# Patient Record
Sex: Female | Born: 1950 | Race: White | Hispanic: No | Smoking: Never smoker
Health system: Southern US, Community
[De-identification: ages and names within clinical notes are randomized; demographics above are authoritative.]

## PROBLEM LIST (undated history)

## (undated) DIAGNOSIS — N189 Chronic kidney disease, unspecified: Secondary | ICD-10-CM

## (undated) DIAGNOSIS — K219 Gastro-esophageal reflux disease without esophagitis: Secondary | ICD-10-CM

## (undated) DIAGNOSIS — E785 Hyperlipidemia, unspecified: Secondary | ICD-10-CM

## (undated) DIAGNOSIS — Z8619 Personal history of other infectious and parasitic diseases: Secondary | ICD-10-CM

## (undated) DIAGNOSIS — J309 Allergic rhinitis, unspecified: Secondary | ICD-10-CM

## (undated) DIAGNOSIS — C801 Malignant (primary) neoplasm, unspecified: Secondary | ICD-10-CM

## (undated) HISTORY — PX: ABDOMINAL HYSTERECTOMY: SHX81

## (undated) HISTORY — PX: CARDIAC CATHETERIZATION: SHX172

## (undated) HISTORY — PX: OTHER SURGICAL HISTORY: SHX169

## (undated) HISTORY — PX: SEPTOPLASTY: SUR1290

## (undated) HISTORY — DX: Hyperlipidemia, unspecified: E78.5

## (undated) HISTORY — PX: TONSILLECTOMY: SUR1361

## (undated) HISTORY — PX: BACK SURGERY: SHX140

---

## 1983-01-15 HISTORY — PX: BREAST BIOPSY: SHX20

## 2003-01-30 ENCOUNTER — Other Ambulatory Visit: Payer: Self-pay

## 2004-01-26 ENCOUNTER — Ambulatory Visit: Payer: Self-pay | Admitting: Internal Medicine

## 2005-03-05 ENCOUNTER — Ambulatory Visit: Payer: Self-pay | Admitting: Internal Medicine

## 2006-03-04 ENCOUNTER — Ambulatory Visit: Payer: Self-pay | Admitting: Gastroenterology

## 2006-03-10 ENCOUNTER — Ambulatory Visit: Payer: Self-pay | Admitting: Internal Medicine

## 2007-05-05 ENCOUNTER — Ambulatory Visit: Payer: Self-pay | Admitting: Internal Medicine

## 2008-05-26 ENCOUNTER — Ambulatory Visit: Payer: Self-pay | Admitting: Internal Medicine

## 2009-01-27 ENCOUNTER — Ambulatory Visit: Payer: Self-pay | Admitting: Internal Medicine

## 2009-11-23 ENCOUNTER — Ambulatory Visit: Payer: Self-pay | Admitting: Internal Medicine

## 2011-01-21 ENCOUNTER — Ambulatory Visit: Payer: Self-pay | Admitting: Internal Medicine

## 2011-05-26 ENCOUNTER — Emergency Department: Payer: Self-pay | Admitting: Emergency Medicine

## 2011-05-26 LAB — COMPREHENSIVE METABOLIC PANEL
Albumin: 4 g/dL (ref 3.4–5.0)
Alkaline Phosphatase: 72 U/L (ref 50–136)
Anion Gap: 7 (ref 7–16)
BUN: 11 mg/dL (ref 7–18)
Co2: 25 mmol/L (ref 21–32)
EGFR (African American): >60
EGFR (Non-African Amer.): >60
Glucose: 97 mg/dL (ref 65–99)
SGPT (ALT): 22 U/L
Sodium: 140 mmol/L (ref 136–145)
Total Protein: 7.6 g/dL (ref 6.4–8.2)

## 2011-05-26 LAB — CK TOTAL AND CKMB (NOT AT ARMC): CK-MB: 1.3 ng/mL (ref 0.5–3.6)

## 2011-05-26 LAB — CBC
HCT: 44.7 % (ref 35.0–47.0)
MCH: 31.3 pg (ref 26.0–34.0)
MCV: 91 fL (ref 80–100)
Platelet: 145 10*3/uL — ABNORMAL LOW (ref 150–440)
RBC: 4.9 10*6/uL (ref 3.80–5.20)
RDW: 13.1 % (ref 11.5–14.5)
WBC: 5.4 10*3/uL (ref 3.6–11.0)

## 2011-05-26 LAB — PROTIME-INR: Prothrombin Time: 12.2 secs (ref 11.5–14.7)

## 2011-05-28 ENCOUNTER — Encounter: Payer: Self-pay | Admitting: Cardiovascular Disease

## 2011-05-28 ENCOUNTER — Ambulatory Visit (INDEPENDENT_AMBULATORY_CARE_PROVIDER_SITE_OTHER): Payer: BC Managed Care – PPO | Admitting: Cardiovascular Disease

## 2011-05-28 DIAGNOSIS — Z01818 Encounter for other preprocedural examination: Secondary | ICD-10-CM

## 2011-05-28 DIAGNOSIS — E785 Hyperlipidemia, unspecified: Secondary | ICD-10-CM | POA: Insufficient documentation

## 2011-05-28 DIAGNOSIS — R079 Chest pain, unspecified: Secondary | ICD-10-CM

## 2011-05-28 DIAGNOSIS — R0602 Shortness of breath: Secondary | ICD-10-CM

## 2011-05-28 DIAGNOSIS — Z5181 Encounter for therapeutic drug level monitoring: Secondary | ICD-10-CM

## 2011-05-28 MED ORDER — NITROGLYCERIN 0.4 MG SL SUBL: 0.4000 mg | SUBLINGUAL_TABLET | SUBLINGUAL | Status: DC | PRN

## 2011-05-28 NOTE — Assessment & Plan Note (Signed)
Chest pain is concerning for angina. We have discussed the various treatment options with her which include Myoview stress testing and cardiac catheterization. She will discuss this with her husband and we will contact her in the next day. She is very concerned about her symptoms and reports that she would be still worried even with a normal stress test. She is leaning towards a cardiac catheterization given her continued symptoms. She has to drive to the hospital to take care of her husband and he is coming home after chemotherapy in the next day or so. If symptoms are stable, she would consider cardiac catheterization early next week, possibly later this week if symptoms get worse.   We have given her nitroglycerin sublingual for chest pain. We will also recheck her cardiac enzymes today.

## 2011-05-28 NOTE — Patient Instructions (Signed)
You are doing well. Please take nitroglycerin for severe chest pain  We will talk in the next day or so about cardiac cath versus  Stress test We will check your kidney function function and xray today  Please call us if you have new issues that need to be addressed before your next appt.

## 2011-05-28 NOTE — Assessment & Plan Note (Signed)
Cholesterol continues to be elevated on simvastatin 20 mg daily. We did not change her medications at this time and we'll discuss additional options and followup.

## 2011-05-28 NOTE — Assessment & Plan Note (Signed)
Clinical exam is essentially benign. She continues to have mild shortness of breath. We have recommended cardiac catheterization.

## 2011-05-28 NOTE — Progress Notes (Signed)
Patient ID: Nicole Sharp, female    DOB: 1950/03/01, 61 y.o.   MRN: [id number]  HPI Comments: [name] is a very pleasant 61 year old woman, patient of Dr. Bethann Punches, with history of hyperlipidemia, otherwise no significant cardiac issues, with recent stressors from her husband who was diagnosed with leukemia and is actively undergoing chemotherapy who is active at baseline, Place tennis several times a week and walks for exercise alternate days who presents for evaluation after recent episodes of chest pain.  She reports that she was at church this past Sunday when she was singing while standing and she developed acute onset of severe chest pain. She sat down in her chest pain resolved. Chest pain recurred while sitting waxing and waning for several more episodes associated with shortness of breath and dizziness. She was lightheaded and she was escorted outside and was tended to by Dr. Erline Hau. He mentioned to her that her presentation was quite worrisome for cardiac etiology.   Since then, she has had waxing and waning symptoms of chest tightness, shortness of breath, malaise, neck tightness, some discomfort radiating to her arm. She has not felt well. She is trying to tend to her husband who is undergoing chemotherapy for leukemia. She was previously on Xanax but has not taken this for over one week and did a slow wean.   She reports recent total cholesterol 215, LDL 130. She has been on low-dose statin for 20 years her cholesterol continues to be elevated. She has been unable to tolerate several statins.   She does report mother has history of CVA at age 56, father died of colon cancer at age 58  EKG shows normal sinus rhythm with rate 69 beats per minute with no significant ST or T wave changes EKG from the hospital shows normal sinus rhythm with rate 63 beats per minute, no significant ST or T wave changes Blood work from the hospital shows normal cardiac enzymes x2     Outpatient Encounter Prescriptions as of 05/28/2011  Medication Sig Dispense Refill  . aspirin 81 MG tablet Take 81 mg by mouth daily.      . nitroGLYCERIN (NITROSTAT) 0.4 MG SL tablet Place 1 tablet (0.4 mg total) under the tongue every 5 (five) minutes as needed for chest pain.  25 tablet  3  . pantoprazole (PROTONIX) 40 MG tablet Take 40 mg by mouth daily.      Marland Kitchen SIMVASTATIN PO Take 20 mg by mouth daily.        Review of Systems  Constitutional: Negative.   HENT: Negative.        Neck tightness  Eyes: Negative.   Respiratory: Positive for chest tightness and shortness of breath.   Cardiovascular: Negative.   Gastrointestinal: Negative.   Musculoskeletal: Negative.   Skin: Negative.   Neurological: Negative.   Hematological: Negative.   Psychiatric/Behavioral: Negative.   All other systems reviewed and are negative.    BP 126/86  Pulse 69  Ht 5' 6.5" (1.689 m)  Wt 174 lb 1.9 oz (78.98 kg)  BMI 27.68 kg/m2  Physical Exam  Nursing note and vitals reviewed. Constitutional: She is oriented to person, place, and time. She appears well-developed and well-nourished.  HENT:  Head: Normocephalic.  Nose: Nose normal.  Mouth/Throat: Oropharynx is clear and moist.  Eyes: Conjunctivae are normal. Pupils are equal, round, and reactive to light.  Neck: Normal range of motion. Neck supple. No JVD present.  Cardiovascular: Normal rate, regular rhythm, S1 normal,  S2 normal, normal heart sounds and intact distal pulses.  Exam reveals no gallop and no friction rub.   No murmur heard. Pulmonary/Chest: Effort normal and breath sounds normal. No respiratory distress. She has no wheezes. She has no rales. She exhibits no tenderness.  Abdominal: Soft. Bowel sounds are normal. She exhibits no distension. There is no tenderness.  Musculoskeletal: Normal range of motion. She exhibits no edema and no tenderness.  Lymphadenopathy:    She has no cervical adenopathy.  Neurological: She is alert  and oriented to person, place, and time. Coordination normal.  Skin: Skin is warm and dry. No rash noted. No erythema.  Psychiatric: She has a normal mood and affect. Her behavior is normal. Judgment and thought content normal.         Assessment and Plan

## 2011-05-29 ENCOUNTER — Other Ambulatory Visit: Payer: Self-pay | Admitting: Cardiovascular Disease

## 2011-05-29 ENCOUNTER — Telehealth: Payer: Self-pay

## 2011-05-29 ENCOUNTER — Encounter (HOSPITAL_COMMUNITY): Payer: Self-pay | Admitting: Respiratory Therapy

## 2011-05-29 NOTE — H&P (Signed)
[name] is a very pleasant 61 year old woman, patient of Dr. Bethann Punches, with history of hyperlipidemia, otherwise no significant cardiac issues, with recent stressors from her husband who was diagnosed with leukemia and is actively undergoing chemotherapy who is active at baseline, Place tennis several times a week and walks for exercise alternate days who presents for evaluation after recent episodes of chest pain.  She reports that she was at church this past Sunday when she was singing while standing and she developed acute onset of severe chest pain. She sat down in her chest pain resolved. Chest pain recurred while sitting waxing and waning for several more episodes associated with shortness of breath and dizziness. She was lightheaded and she was escorted outside and was tended to by Dr. Erline Hau. He mentioned to her that her presentation was quite worrisome for cardiac etiology.  Since then, she has had waxing and waning symptoms of chest tightness, shortness of breath, malaise, neck tightness, some discomfort radiating to her arm. She has not felt well. She is trying to tend to her husband who is undergoing chemotherapy for leukemia. She was previously on Xanax but has not taken this for over one week and did a slow wean.  She reports recent total cholesterol 215, LDL 130. She has been on low-dose statin for 20 years her cholesterol continues to be elevated. She has been unable to tolerate several statins.  She does report mother has history of CVA at age 39, father died of colon cancer at age 45  EKG shows normal sinus rhythm with rate 69 beats per minute with no significant ST or T wave changes  EKG from the hospital shows normal sinus rhythm with rate 63 beats per minute, no significant ST or T wave changes  Blood work from the hospital shows normal cardiac enzymes x2   Outpatient Encounter Prescriptions as of 05/28/2011   Medication  Sig  Dispense  Refill   .  aspirin 81 MG tablet   Take 81 mg by mouth daily.     .  nitroGLYCERIN (NITROSTAT) 0.4 MG SL tablet  Place 1 tablet (0.4 mg total) under the tongue every 5 (five) minutes as needed for chest pain.  25 tablet  3   .  pantoprazole (PROTONIX) 40 MG tablet  Take 40 mg by mouth daily.     Marland Kitchen  SIMVASTATIN PO  Take 20 mg by mouth daily.      Review of Systems  Constitutional: Negative.  HENT: Negative.  Neck tightness  Eyes: Negative.  Respiratory: Positive for chest tightness and shortness of breath.  Cardiovascular: Negative.  Gastrointestinal: Negative.  Musculoskeletal: Negative.  Skin: Negative.  Neurological: Negative.  Hematological: Negative.  Psychiatric/Behavioral: Negative.  All other systems reviewed and are negative.   BP 126/86  Pulse 69  Ht 5' 6.5" (1.689 m)  Wt 174 lb 1.9 oz (78.98 kg)  BMI 27.68 kg/m2  Physical Exam  Nursing note and vitals reviewed.  Constitutional: She is oriented to person, place, and time. She appears well-developed and well-nourished.  HENT:  Head: Normocephalic.  Nose: Nose normal.  Mouth/Throat: Oropharynx is clear and moist.  Eyes: Conjunctivae are normal. Pupils are equal, round, and reactive to light.  Neck: Normal range of motion. Neck supple. No JVD present.  Cardiovascular: Normal rate, regular rhythm, S1 normal, S2 normal, normal heart sounds and intact distal pulses. Exam reveals no gallop and no friction rub.  No murmur heard.  Pulmonary/Chest: Effort normal and breath sounds normal.  No respiratory distress. She has no wheezes. She has no rales. She exhibits no tenderness.  Abdominal: Soft. Bowel sounds are normal. She exhibits no distension. There is no tenderness.  Musculoskeletal: Normal range of motion. She exhibits no edema and no tenderness.  Lymphadenopathy:  She has no cervical adenopathy.  Neurological: She is alert and oriented to person, place, and time. Coordination normal.  Skin: Skin is warm and dry. No rash noted. No erythema.  Psychiatric:  She has a normal mood and affect. Her behavior is normal. Judgment and thought content normal.    Assessment and Plan    Chest pain -  Chest pain is concerning for angina. We have discussed the various treatment options with her which include Myoview stress testing and cardiac catheterization. She will discuss this with her husband and we will contact her in the next day. She is very concerned about her symptoms and reports that she would be still worried even with a normal stress test. She is leaning towards a cardiac catheterization given her continued symptoms. She has to drive to the hospital to take care of her husband and he is coming home after chemotherapy in the next day or so. If symptoms are stable, she would consider cardiac catheterization early next week, possibly later this week if symptoms get worse.  We have given her nitroglycerin sublingual for chest pain. We will also recheck her cardiac enzymes today.  Shortness of breath -  Clinical exam is essentially benign. She continues to have mild shortness of breath. We have recommended cardiac catheterization.  Hyperlipidemia -  Cholesterol continues to be elevated on simvastatin 20 mg daily. We did not change her medications at this time and we'll discuss additional options and followup.

## 2011-05-29 NOTE — Telephone Encounter (Signed)
The patient is calling and ready to have the cardiac cath set up for tomorrow May, 16, 2013. Told patient need to arrive at 11:30 am. She is aware of instructions regarding medication and to not eat or drink anything after midnight the night before. She will not have any caffeine of decaf the am of procedure.

## 2011-05-30 ENCOUNTER — Encounter (HOSPITAL_COMMUNITY)
Admission: RE | Disposition: A | Discharge status: Home / Self Care | Payer: Self-pay | Source: Ambulatory Visit | Attending: Cardiovascular Disease

## 2011-05-30 ENCOUNTER — Ambulatory Visit (HOSPITAL_COMMUNITY)
Admission: RE | Admit: 2011-05-30 | Discharge: 2011-05-30 | Disposition: A | Discharge status: Home / Self Care | Payer: BC Managed Care – PPO | Source: Ambulatory Visit | Attending: Cardiovascular Disease | Admitting: Cardiovascular Disease

## 2011-05-30 ENCOUNTER — Encounter (HOSPITAL_COMMUNITY): Payer: Self-pay | Admitting: Cardiovascular Disease

## 2011-05-30 DIAGNOSIS — R0602 Shortness of breath: Secondary | ICD-10-CM

## 2011-05-30 DIAGNOSIS — Z7982 Long term (current) use of aspirin: Secondary | ICD-10-CM | POA: Insufficient documentation

## 2011-05-30 DIAGNOSIS — R079 Chest pain, unspecified: Secondary | ICD-10-CM | POA: Insufficient documentation

## 2011-05-30 DIAGNOSIS — Z79899 Other long term (current) drug therapy: Secondary | ICD-10-CM | POA: Insufficient documentation

## 2011-05-30 DIAGNOSIS — E785 Hyperlipidemia, unspecified: Secondary | ICD-10-CM | POA: Insufficient documentation

## 2011-05-30 HISTORY — PX: LEFT HEART CATHETERIZATION WITH CORONARY ANGIOGRAM: SHX5451

## 2011-05-30 SURGERY — LEFT HEART CATHETERIZATION WITH CORONARY ANGIOGRAM
Anesthesia: LOCAL

## 2011-05-30 MED ORDER — ACETAMINOPHEN 325 MG PO TABS: 650.0000 mg | ORAL_TABLET | ORAL | Status: DC | PRN

## 2011-05-30 MED ORDER — NITROGLYCERIN 0.2 MG/ML ON CALL CATH LAB
INTRAVENOUS | Status: AC
  Filled 2011-05-30: qty 1

## 2011-05-30 MED ORDER — HEPARIN (PORCINE) IN NACL 2-0.9 UNIT/ML-% IJ SOLN
INTRAMUSCULAR | Status: AC
  Filled 2011-05-30: qty 2000

## 2011-05-30 MED ORDER — LIDOCAINE HCL (PF) 1 % IJ SOLN
INTRAMUSCULAR | Status: AC
  Filled 2011-05-30: qty 30

## 2011-05-30 MED ORDER — ONDANSETRON HCL 4 MG/2ML IJ SOLN: 4.0000 mg | Freq: Four times a day (QID) | INTRAMUSCULAR | Status: DC | PRN

## 2011-05-30 MED ORDER — MORPHINE SULFATE 10 MG/ML IJ SOLN: 2.0000 mg | Freq: Once | INTRAMUSCULAR | Status: DC

## 2011-05-30 MED ORDER — KETOROLAC TROMETHAMINE 30 MG/ML IJ SOLN
30.0000 mg | Freq: Once | INTRAMUSCULAR | Status: DC
  Filled 2011-05-30: qty 1

## 2011-05-30 MED ORDER — SODIUM CHLORIDE 0.9 % IV SOLN: INTRAVENOUS | Status: DC

## 2011-05-30 MED ORDER — SODIUM CHLORIDE 0.9 % IJ SOLN: 3.0000 mL | Freq: Two times a day (BID) | INTRAMUSCULAR | Status: DC

## 2011-05-30 MED ORDER — ASPIRIN 81 MG PO CHEW
324.0000 mg | CHEWABLE_TABLET | ORAL | Status: AC
  Administered 2011-05-30: 324 mg via ORAL
  Filled 2011-05-30: qty 4

## 2011-05-30 MED ORDER — FENTANYL CITRATE 0.05 MG/ML IJ SOLN
INTRAMUSCULAR | Status: AC
  Filled 2011-05-30: qty 2

## 2011-05-30 NOTE — Progress Notes (Signed)
STATES DOES NOT WANT TO TAKE TORADOL

## 2011-05-30 NOTE — Op Note (Signed)
Cardiac Catheterization Procedure Note  Name: Nicole Sharp MRN: [id number] DOB: 02-19-1950  Procedure: Left Heart Cath, Selective Coronary Angiography, LV angiography  Indication:  Angina, SOB  Procedural details: The right groin was prepped, draped, and anesthetized with 1% lidocaine. Using modified Seldinger technique, a 5 French sheath was introduced into the right femoral artery. Standard Judkins catheters were used for coronary angiography and left ventriculography. Catheter exchanges were performed over a guidewire. There were no immediate procedural complications. The patient was transferred to the post catheterization recovery area for further monitoring.  Procedural Findings:  Hemodynamics:     AO 148/75    LV 130/6   Coronary angiography:  Coronary dominance: Right   Left mainstem:   Large short vessel that bifurcates into the LAD and LCX. No significant disease noted.   Left anterior descending (LAD):   Moderate to large size vessel that has two moderate sized diagonal branches. No significant disease noted.   Left circumflex (LCx):  Moderate to large sized vessel with moderate sized OM1 that takes off in the proximal to mid region. No significant disease noted.   Right coronary artery (RCA):  Dominant large branch with PDA and PLV. No significant disease noted.   Left ventriculography: Left ventricular systolic function is normal, LVEF is estimated at 55-65%, there is no significant mitral regurgitation. No aortic valve stenosis.   Final Conclusions:  Right dominant coronary system with no significant disease noted.  Recommendations: Unable to exclude coronary spasm as a cause of her symptoms. We have suggested she try NTG sl for severe symptoms. If this helps, we could try isosorbide. She can call the office for a script if needed. Would recommend 1 to 2 week follow up.   Julien Nordmann 05/30/2011, 3:34 PM

## 2011-05-30 NOTE — Discharge Instructions (Signed)
Please Follow up in clinic in 1 to 2 weeks in [place] ([facility]) Try NTG SL for severe chest pain If this helps, you could call the office and we could try a long acting nitro (isosorbide)Groin Site Care Refer to this sheet in the next few weeks. These instructions provide you with information on caring for yourself after your procedure. Your caregiver may also give you more specific instructions. Your treatment has been planned according to current medical practices, but problems sometimes occur. Call your caregiver if you have any problems or questions after your procedure. HOME CARE INSTRUCTIONS  You may shower 24 hours after the procedure. Remove the bandage (dressing) and gently wash the site with plain soap and water. Gently pat the site dry.   Do not apply powder or lotion to the site.   Do not sit in a bathtub, swimming pool, or whirlpool for 5 to 7 days.   No bending, squatting, or lifting anything over 10 pounds (4.5 kg) as directed by your caregiver.   Inspect the site at least twice daily.   Do not drive home if you are discharged the same day of the procedure. Have someone else drive you.   You may drive 24 hours after the procedure unless otherwise instructed by your caregiver.  What to expect:  Any bruising will usually fade within 1 to 2 weeks.   Blood that collects in the tissue (hematoma) may be painful to the touch. It should usually decrease in size and tenderness within 1 to 2 weeks.  SEEK IMMEDIATE MEDICAL CARE IF:  You have unusual pain at the groin site or down the affected leg.   You have redness, warmth, swelling, or pain at the groin site.   You have drainage (other than a small amount of blood on the dressing).   You have chills.   You have a fever or persistent symptoms for more than 72 hours.   You have a fever and your symptoms suddenly get worse.   Your leg becomes pale, cool, tingly, or numb.   You have heavy bleeding from the site.  Hold pressure on the site.  Document Released: 02/02/2010 Document Revised: 12/20/2010 Document Reviewed: 02/02/2010 [facility] Patient Information 2012 [place], [place].

## 2011-05-30 NOTE — Progress Notes (Signed)
C/O PAIN LEFT CHEST UNDER BREAST WITH INSPIRATION; DR Mariah Milling NOTIFIED AND ORDERS NOTED

## 2011-06-03 ENCOUNTER — Ambulatory Visit: Payer: Self-pay | Admitting: Internal Medicine

## 2012-01-27 ENCOUNTER — Ambulatory Visit: Payer: Self-pay | Admitting: Internal Medicine

## 2013-04-12 ENCOUNTER — Ambulatory Visit: Payer: Self-pay | Admitting: Internal Medicine

## 2013-04-14 ENCOUNTER — Ambulatory Visit: Payer: Self-pay | Admitting: Internal Medicine

## 2013-12-23 ENCOUNTER — Encounter (HOSPITAL_COMMUNITY): Payer: Self-pay | Admitting: Cardiovascular Disease

## 2014-02-16 ENCOUNTER — Ambulatory Visit: Payer: Self-pay | Admitting: Internal Medicine

## 2015-07-12 ENCOUNTER — Encounter: Payer: Self-pay | Admitting: *Deleted

## 2015-07-13 ENCOUNTER — Ambulatory Visit
Admission: RE | Admit: 2015-07-13 | Discharge: 2015-07-13 | Disposition: A | Discharge status: Home / Self Care | Payer: Medicare Other | Source: Ambulatory Visit | Attending: Gastroenterology | Admitting: Gastroenterology

## 2015-07-13 ENCOUNTER — Encounter: Payer: Self-pay | Admitting: *Deleted

## 2015-07-13 ENCOUNTER — Encounter
Admission: RE | Disposition: A | Discharge status: Home / Self Care | Payer: Self-pay | Source: Ambulatory Visit | Attending: Gastroenterology

## 2015-07-13 ENCOUNTER — Ambulatory Visit: Payer: Medicare Other | Admitting: Anesthesiology

## 2015-07-13 DIAGNOSIS — E785 Hyperlipidemia, unspecified: Secondary | ICD-10-CM | POA: Insufficient documentation

## 2015-07-13 DIAGNOSIS — R0602 Shortness of breath: Secondary | ICD-10-CM | POA: Diagnosis not present

## 2015-07-13 DIAGNOSIS — Z8249 Family history of ischemic heart disease and other diseases of the circulatory system: Secondary | ICD-10-CM | POA: Diagnosis not present

## 2015-07-13 DIAGNOSIS — K5730 Diverticulosis of large intestine without perforation or abscess without bleeding: Secondary | ICD-10-CM | POA: Insufficient documentation

## 2015-07-13 DIAGNOSIS — Z90710 Acquired absence of both cervix and uterus: Secondary | ICD-10-CM | POA: Insufficient documentation

## 2015-07-13 DIAGNOSIS — K317 Polyp of stomach and duodenum: Secondary | ICD-10-CM | POA: Insufficient documentation

## 2015-07-13 DIAGNOSIS — J309 Allergic rhinitis, unspecified: Secondary | ICD-10-CM | POA: Insufficient documentation

## 2015-07-13 DIAGNOSIS — Z7982 Long term (current) use of aspirin: Secondary | ICD-10-CM | POA: Insufficient documentation

## 2015-07-13 DIAGNOSIS — D509 Iron deficiency anemia, unspecified: Secondary | ICD-10-CM | POA: Insufficient documentation

## 2015-07-13 DIAGNOSIS — N189 Chronic kidney disease, unspecified: Secondary | ICD-10-CM | POA: Diagnosis not present

## 2015-07-13 DIAGNOSIS — K640 First degree hemorrhoids: Secondary | ICD-10-CM | POA: Diagnosis not present

## 2015-07-13 DIAGNOSIS — K259 Gastric ulcer, unspecified as acute or chronic, without hemorrhage or perforation: Secondary | ICD-10-CM | POA: Insufficient documentation

## 2015-07-13 DIAGNOSIS — K2980 Duodenitis without bleeding: Secondary | ICD-10-CM | POA: Insufficient documentation

## 2015-07-13 DIAGNOSIS — Z87442 Personal history of urinary calculi: Secondary | ICD-10-CM | POA: Diagnosis not present

## 2015-07-13 DIAGNOSIS — Z79899 Other long term (current) drug therapy: Secondary | ICD-10-CM | POA: Insufficient documentation

## 2015-07-13 DIAGNOSIS — Z800 Family history of malignant neoplasm of digestive organs: Secondary | ICD-10-CM | POA: Insufficient documentation

## 2015-07-13 DIAGNOSIS — Z823 Family history of stroke: Secondary | ICD-10-CM | POA: Diagnosis not present

## 2015-07-13 DIAGNOSIS — K219 Gastro-esophageal reflux disease without esophagitis: Secondary | ICD-10-CM | POA: Diagnosis not present

## 2015-07-13 DIAGNOSIS — F418 Other specified anxiety disorders: Secondary | ICD-10-CM | POA: Diagnosis not present

## 2015-07-13 DIAGNOSIS — R1013 Epigastric pain: Secondary | ICD-10-CM | POA: Insufficient documentation

## 2015-07-13 HISTORY — PX: ESOPHAGOGASTRODUODENOSCOPY (EGD) WITH PROPOFOL: SHX5813

## 2015-07-13 HISTORY — DX: Allergic rhinitis, unspecified: J30.9

## 2015-07-13 HISTORY — DX: Gastro-esophageal reflux disease without esophagitis: K21.9

## 2015-07-13 HISTORY — DX: Personal history of other infectious and parasitic diseases: Z86.19

## 2015-07-13 HISTORY — DX: Chronic kidney disease, unspecified: N18.9

## 2015-07-13 HISTORY — PX: COLONOSCOPY WITH PROPOFOL: SHX5780

## 2015-07-13 SURGERY — COLONOSCOPY WITH PROPOFOL
Anesthesia: General

## 2015-07-13 MED ORDER — FENTANYL CITRATE (PF) 100 MCG/2ML IJ SOLN
INTRAMUSCULAR | Status: DC | PRN
Start: 2015-07-13 — End: 2015-07-13
  Administered 2015-07-13: 50 ug via INTRAVENOUS

## 2015-07-13 MED ORDER — PROPOFOL 500 MG/50ML IV EMUL
INTRAVENOUS | Status: DC | PRN
  Administered 2015-07-13: 160 ug/kg/min via INTRAVENOUS

## 2015-07-13 MED ORDER — PHENYLEPHRINE HCL 10 MG/ML IJ SOLN
INTRAMUSCULAR | Status: DC | PRN
  Administered 2015-07-13 (×2): 100 ug via INTRAVENOUS

## 2015-07-13 MED ORDER — SODIUM CHLORIDE 0.9 % IV SOLN: INTRAVENOUS | Status: DC

## 2015-07-13 MED ORDER — SODIUM CHLORIDE 0.9 % IV SOLN
INTRAVENOUS | Status: DC
  Administered 2015-07-13 (×2): via INTRAVENOUS

## 2015-07-13 MED ORDER — MIDAZOLAM HCL 5 MG/5ML IJ SOLN
INTRAMUSCULAR | Status: DC | PRN
Start: 2015-07-13 — End: 2015-07-13
  Administered 2015-07-13: 1 mg via INTRAVENOUS

## 2015-07-13 MED ORDER — PROPOFOL 10 MG/ML IV BOLUS
INTRAVENOUS | Status: DC | PRN
  Administered 2015-07-13: 100 mg via INTRAVENOUS

## 2015-07-13 MED ORDER — LIDOCAINE 2% (20 MG/ML) 5 ML SYRINGE
INTRAMUSCULAR | Status: DC | PRN
  Administered 2015-07-13: 40 mg via INTRAVENOUS

## 2015-07-13 NOTE — Op Note (Signed)
[facility] Patient Name: Nicole Sharp Procedure Date: 07/13/2015 9:52 AM MRN: [id number] Account #: [id number] Date of Birth: 02-04-1950 Admit Type: Outpatient Age: 65 Room: [facility] ENDO ROOM 1 Gender: Female Note Status: Finalized Procedure:            Colonoscopy Indications:          Iron deficiency anemia, Family history of colon cancer                        in a first-degree relative Providers:            Lollie Sails, MD Referring MD:         Rusty Aus, MD (Referring MD) Medicines:            Monitored Anesthesia Care Complications:        No immediate complications. Procedure:            Pre-Anesthesia Assessment:                       - ASA Grade Assessment: II - A patient with mild                        systemic disease.                       After obtaining informed consent, the colonoscope was                        passed under direct vision. Throughout the procedure,                        the patient's blood pressure, pulse, and oxygen                        saturations were monitored continuously. The                        Colonoscope was introduced through the anus and                        advanced to the the cecum, identified by appendiceal                        orifice and ileocecal valve. The colonoscopy was                        performed with moderate difficulty. Successful                        completion of the procedure was aided by using manual                        pressure. The patient tolerated the procedure well. The                        quality of the bowel preparation was good. Findings:      A few small-mouthed diverticula were found in the descending colon.      Non-bleeding internal hemorrhoids were found during retroflexion and       during anoscopy. The hemorrhoids were small and Grade I (internal  hemorrhoids that do not prolapse).      The exam was otherwise normal throughout the  examined colon.      No additional abnormalities were found on retroflexion.      The digital rectal exam was normal. Impression:           - Diverticulosis in the descending colon.                       - Non-bleeding internal hemorrhoids.                       - No specimens collected. Recommendation:       - Discharge patient to home.                       - Perform a small bowel follow through at appointment                        to be scheduled.                       - To visualize the small bowel, perform video capsule                        endoscopy at appointment to be scheduled. Procedure Code(s):    --- Professional ---                       [phone], Colonoscopy, flexible; diagnostic, including                        collection of specimen(s) by brushing or washing, when                        performed (separate procedure) Diagnosis Code(s):    --- Professional ---                       K64.0, First degree hemorrhoids                       D50.9, Iron deficiency anemia, unspecified                       Z80.0, Family history of malignant neoplasm of                        digestive organs                       K57.30, Diverticulosis of large intestine without                        perforation or abscess without bleeding CPT copyright 2016 American Medical Association. All rights reserved. The codes documented in this report are preliminary and upon coder review may  be revised to meet current compliance requirements. Lollie Sails, MD 07/13/2015 10:36:47 AM This report has been signed electronically. Number of Addenda: 0 Note Initiated On: 07/13/2015 9:52 AM Scope Withdrawal Time: 0 hours 7 minutes 44 seconds  Total Procedure Duration: 0 hours 17 minutes 24 seconds       [facility]

## 2015-07-13 NOTE — Op Note (Signed)
[facility] Patient Name: Nicole Sharp Procedure Date: 07/13/2015 9:52 AM MRN: [id number] Account #: [id number] Date of Birth: Aug 08, 1950 Admit Type: Outpatient Age: 65 Room: [facility] ENDO ROOM 1 Gender: Female Note Status: Finalized Procedure:            Upper GI endoscopy Indications:          Iron deficiency anemia, Dyspepsia Providers:            Lollie Sails, MD Referring MD:         Rusty Aus, MD (Referring MD) Medicines:            Monitored Anesthesia Care Complications:        No immediate complications. Procedure:            Pre-Anesthesia Assessment:                       - ASA Grade Assessment: II - A patient with mild                        systemic disease.                       After obtaining informed consent, the endoscope was                        passed under direct vision. Throughout the procedure,                        the patient's blood pressure, pulse, and oxygen                        saturations were monitored continuously. The Endoscope                        was introduced through the mouth, and advanced to the                        third part of duodenum. The upper GI endoscopy was                        accomplished without difficulty. The patient tolerated                        the procedure well. Findings:      The Z-line was irregular. Biopsies were taken with a cold forceps for       histology.      The exam of the esophagus was otherwise normal.      Patchy mild inflammation characterized by congestion (edema), erosions       and erythema was found in the gastric antrum. Biopsies were taken with a       cold forceps for histology. Biopsies were taken with a cold forceps for       Helicobacter pylori testing.      The cardia and gastric fundus were normal on retroflexion.      Patchy mild inflammation characterized by congestion (edema) and       erythema was found in the duodenal bulb and in the  second portion of the       duodenum.      The exam of the duodenum was otherwise normal. Impression:           -  Z-line irregular. Biopsied.                       - Erosive gastritis. Biopsied.                       - Duodenitis. Recommendation:       - Continue present medications.                       - Return to GI clinic in 3 weeks.                       - Await pathology results. Procedure Code(s):    --- Professional ---                       [phone], Esophagogastroduodenoscopy, flexible, transoral;                        with biopsy, single or multiple Diagnosis Code(s):    --- Professional ---                       K22.8, Other specified diseases of esophagus                       K29.60, Other gastritis without bleeding                       K29.80, Duodenitis without bleeding                       D50.9, Iron deficiency anemia, unspecified                       R10.13, Epigastric pain CPT copyright 2016 American Medical Association. All rights reserved. The codes documented in this report are preliminary and upon coder review may  be revised to meet current compliance requirements. Lollie Sails, MD 07/13/2015 10:10:22 AM This report has been signed electronically. Number of Addenda: 0 Note Initiated On: 07/13/2015 9:52 AM      [facility]

## 2015-07-13 NOTE — Anesthesia Postprocedure Evaluation (Signed)
Anesthesia Post Note  Patient: Nicole Sharp  Procedure(s) Performed: Procedure(s) (LRB): COLONOSCOPY WITH PROPOFOL (N/A) ESOPHAGOGASTRODUODENOSCOPY (EGD) WITH PROPOFOL (N/A)  Patient location during evaluation: Endoscopy Anesthesia Type: General Level of consciousness: awake and alert Pain management: pain level controlled Vital Signs Assessment: post-procedure vital signs reviewed and stable Respiratory status: spontaneous breathing, nonlabored ventilation, respiratory function stable and patient connected to nasal cannula oxygen Cardiovascular status: blood pressure returned to baseline and stable Postop Assessment: no signs of nausea or vomiting Anesthetic complications: no    Last Vitals:  Filed Vitals:   07/13/15 1100 07/13/15 1110  BP: 117/63 144/76  Pulse: 57 56  Temp:    Resp: 13 16    Last Pain: There were no vitals filed for this visit.               Shantel Wesely S

## 2015-07-13 NOTE — H&P (Signed)
Outpatient short stay form Pre-procedure 07/13/2015 9:45 AM Nicole Sails MD  Primary Physician: Dr. Emily Filbert  Reason for visit:  EGD and colonoscopy  History of present illness:  Patient is a 65 year old female presenting today for procedures as above. She has recent diagnosis of anemia this being microcytic. Has been some dyspepsia symptoms. Patient also has a family history of colon cancer primary relative. Her last colonoscopy was about 5 years ago. Tolerated her prep well. She does take 81 mg aspirin daily which she held over the weekend. She takes no other aspirin products or blood thinning agents.    Current facility-administered medications:  .  0.9 %  sodium chloride infusion, , Intravenous, Continuous, Nicole Sails, MD, Last Rate: 20 mL/hr at 07/13/15 0911 .  0.9 %  sodium chloride infusion, , Intravenous, Continuous, Nicole Sails, MD  Prescriptions prior to admission  Medication Sig Dispense Refill Last Dose  . aspirin 81 MG tablet Take 162 mg by mouth daily.    Past Week at Unknown time  . calcium carbonate (OSCAL) 1500 (600 Ca) MG TABS tablet Take 600 mg of elemental calcium by mouth daily with breakfast.     . estradiol (CLIMARA - DOSED IN MG/24 HR) 0.05 mg/24hr Place 1 patch onto the skin once a week.   07/12/2015 at Unknown time  . Iron-Vitamin C (VITRON-C) 65-125 MG TABS Take 125 mg by mouth.   Past Week at Unknown time  . Multiple Vitamin (MULTIVITAMIN) tablet Take 1 tablet by mouth daily.   Past Week at Unknown time  . omeprazole (PRILOSEC) 40 MG capsule Take 40 mg by mouth daily.   Past Week at Unknown time  . simvastatin (ZOCOR) 20 MG tablet Take 20 mg by mouth every evening.   Past Month at Unknown time  . sucralfate (CARAFATE) 1 g tablet Take 1 g by mouth 4 (four) times daily -  with meals and at bedtime.   Past Week at Unknown time  . etodolac (LODINE) 400 MG tablet Take 400 mg by mouth 2 (two) times daily. Reported on 07/13/2015   Not Taking at Unknown  time  . nitroGLYCERIN (NITROSTAT) 0.4 MG SL tablet Place 1 tablet (0.4 mg total) under the tongue every 5 (five) minutes as needed for chest pain. 25 tablet 3 Unknown at Unknown  . pantoprazole (PROTONIX) 40 MG tablet Take 40 mg by mouth daily. Reported on 07/13/2015   Not Taking at Unknown time     Allergies  Allergen Reactions  . Lipitor [Atorvastatin]   . Pravachol [Pravastatin Sodium]      Past Medical History  Diagnosis Date  . Hyperlipidemia   . Allergic rhinitis   . GERD (gastroesophageal reflux disease)   . History of chicken pox   . Chronic kidney disease     Review of systems:      Physical Exam    Heart and lungs: Regular rate and rhythm without rub or gallop, lungs are bilaterally clear.    HEENT: Normocephalic atraumatic eyes are anicteric    Other:     Pertinant exam for procedure: Soft nontender nondistended bowel sounds positive normoactive.    Planned proceedures: EGD, colonoscopy and indicated procedures. I have discussed the risks benefits and complications of procedures to include not limited to bleeding, infection, perforation and the risk of sedation and the patient wishes to proceed.    Nicole Sails, MD Gastroenterology 07/13/2015  9:45 AM

## 2015-07-13 NOTE — Anesthesia Preprocedure Evaluation (Addendum)
Anesthesia Evaluation  Patient identified by MRN, date of birth, ID band Patient awake    Reviewed: Allergy & Precautions, NPO status , Patient's Chart, lab work & pertinent test results, reviewed documented beta blocker date and time   Airway Mallampati: II  TM Distance: >3 FB     Dental  (+) Chipped   Pulmonary shortness of breath,           Cardiovascular      Neuro/Psych    GI/Hepatic   Endo/Other    Renal/GU      Musculoskeletal   Abdominal   Peds  Hematology   Anesthesia Other Findings No cardiac stents. No NTG.  Reproductive/Obstetrics                            Anesthesia Physical Anesthesia Plan  ASA: III  Anesthesia Plan: General   Post-op Pain Management:    Induction: Intravenous  Airway Management Planned: Nasal Cannula  Additional Equipment:   Intra-op Plan:   Post-operative Plan:   Informed Consent: I have reviewed the patients History and Physical, chart, labs and discussed the procedure including the risks, benefits and alternatives for the proposed anesthesia with the patient or authorized representative who has indicated his/her understanding and acceptance.     Plan Discussed with: CRNA  Anesthesia Plan Comments:         Anesthesia Quick Evaluation

## 2015-07-13 NOTE — Transfer of Care (Signed)
Immediate Anesthesia Transfer of Care Note  Patient: Nicole Sharp  Procedure(s) Performed: Procedure(s): COLONOSCOPY WITH PROPOFOL (N/A) ESOPHAGOGASTRODUODENOSCOPY (EGD) WITH PROPOFOL (N/A)  Patient Location: PACU and Endoscopy Unit  Anesthesia Type:General  Level of Consciousness: sedated  Airway & Oxygen Therapy: Patient Spontanous Breathing and Patient connected to nasal cannula oxygen  Post-op Assessment: Report given to RN and Post -op Vital signs reviewed and stable  Post vital signs: Reviewed and stable  Last Vitals:  Filed Vitals:   07/13/15 0843  BP: 144/80  Pulse: 70  Temp: 35.6 C  Resp: 18    Last Pain: There were no vitals filed for this visit.       Complications: No apparent anesthesia complications

## 2015-07-14 ENCOUNTER — Encounter: Payer: Self-pay | Admitting: Gastroenterology

## 2015-07-14 LAB — SURGICAL PATHOLOGY

## 2015-07-21 ENCOUNTER — Other Ambulatory Visit: Payer: Self-pay | Admitting: Gastroenterology

## 2015-07-21 DIAGNOSIS — D509 Iron deficiency anemia, unspecified: Secondary | ICD-10-CM

## 2015-07-26 ENCOUNTER — Ambulatory Visit
Admission: RE | Admit: 2015-07-26 | Discharge: 2015-07-26 | Disposition: A | Discharge status: Home / Self Care | Payer: Medicare Other | Source: Ambulatory Visit | Attending: Gastroenterology | Admitting: Gastroenterology

## 2015-07-26 DIAGNOSIS — N200 Calculus of kidney: Secondary | ICD-10-CM | POA: Diagnosis not present

## 2015-07-26 DIAGNOSIS — D509 Iron deficiency anemia, unspecified: Secondary | ICD-10-CM | POA: Diagnosis not present

## 2015-10-24 ENCOUNTER — Emergency Department: Payer: Medicare Other

## 2015-10-24 ENCOUNTER — Emergency Department
Admission: EM | Admit: 2015-10-24 | Discharge: 2015-10-24 | Disposition: A | Discharge status: Home / Self Care | Payer: Medicare Other | Attending: Emergency Medicine | Admitting: Emergency Medicine

## 2015-10-24 ENCOUNTER — Encounter: Payer: Self-pay | Admitting: Emergency Medicine

## 2015-10-24 DIAGNOSIS — N189 Chronic kidney disease, unspecified: Secondary | ICD-10-CM | POA: Insufficient documentation

## 2015-10-24 DIAGNOSIS — Z7982 Long term (current) use of aspirin: Secondary | ICD-10-CM | POA: Insufficient documentation

## 2015-10-24 DIAGNOSIS — Z79899 Other long term (current) drug therapy: Secondary | ICD-10-CM | POA: Insufficient documentation

## 2015-10-24 DIAGNOSIS — M545 Low back pain, unspecified: Secondary | ICD-10-CM

## 2015-10-24 DIAGNOSIS — R109 Unspecified abdominal pain: Secondary | ICD-10-CM | POA: Diagnosis present

## 2015-10-24 LAB — URINALYSIS COMPLETE WITH MICROSCOPIC (ARMC ONLY)
BILIRUBIN URINE: NEGATIVE
Glucose, UA: NEGATIVE mg/dL
HGB URINE DIPSTICK: NEGATIVE
Ketones, ur: NEGATIVE mg/dL
LEUKOCYTES UA: NEGATIVE
Nitrite: NEGATIVE
PH: 6 (ref 5.0–8.0)
Protein, ur: NEGATIVE mg/dL
Specific Gravity, Urine: 1.012 (ref 1.005–1.030)

## 2015-10-24 LAB — BASIC METABOLIC PANEL
Anion gap: 3 — ABNORMAL LOW (ref 5–15)
BUN: 13 mg/dL (ref 6–20)
CHLORIDE: 109 mmol/L (ref 101–111)
CO2: 27 mmol/L (ref 22–32)
CREATININE: 0.87 mg/dL (ref 0.44–1.00)
Calcium: 8.5 mg/dL — ABNORMAL LOW (ref 8.9–10.3)
Glucose, Bld: 105 mg/dL — ABNORMAL HIGH (ref 65–99)
POTASSIUM: 4 mmol/L (ref 3.5–5.1)
SODIUM: 139 mmol/L (ref 135–145)

## 2015-10-24 LAB — CBC
HCT: 40.2 % (ref 35.0–47.0)
HEMOGLOBIN: 13.9 g/dL (ref 12.0–16.0)
MCH: 30.1 pg (ref 26.0–34.0)
MCHC: 34.6 g/dL (ref 32.0–36.0)
MCV: 87.1 fL (ref 80.0–100.0)
PLATELETS: 172 10*3/uL (ref 150–440)
RBC: 4.61 MIL/uL (ref 3.80–5.20)
RDW: 14.6 % — ABNORMAL HIGH (ref 11.5–14.5)
WBC: 6 10*3/uL (ref 3.6–11.0)

## 2015-10-24 MED ORDER — CYCLOBENZAPRINE HCL 10 MG PO TABS: 10.0000 mg | ORAL_TABLET | Freq: Three times a day (TID) | ORAL | 0 refills | Status: DC | PRN

## 2015-10-24 MED ORDER — LIDOCAINE 5 % EX PTCH: 1.0000 | MEDICATED_PATCH | Freq: Two times a day (BID) | CUTANEOUS | 0 refills | Status: AC

## 2015-10-24 MED ORDER — SODIUM CHLORIDE 0.9 % IV BOLUS (SEPSIS)
1000.0000 mL | Freq: Once | INTRAVENOUS | Status: AC
  Administered 2015-10-24: 1000 mL via INTRAVENOUS

## 2015-10-24 MED ORDER — ONDANSETRON HCL 4 MG/2ML IJ SOLN
4.0000 mg | Freq: Once | INTRAMUSCULAR | Status: AC
  Administered 2015-10-24: 4 mg via INTRAVENOUS
  Filled 2015-10-24: qty 2

## 2015-10-24 MED ORDER — MORPHINE SULFATE (PF) 4 MG/ML IV SOLN
4.0000 mg | Freq: Once | INTRAVENOUS | Status: AC
  Administered 2015-10-24: 4 mg via INTRAVENOUS
  Filled 2015-10-24: qty 1

## 2015-10-24 MED ORDER — LIDOCAINE 5 % EX PTCH
1.0000 | MEDICATED_PATCH | CUTANEOUS | Status: DC
  Administered 2015-10-24: 1 via TRANSDERMAL
  Filled 2015-10-24: qty 1

## 2015-10-24 MED ORDER — TRAMADOL HCL 50 MG PO TABS: 50.0000 mg | ORAL_TABLET | Freq: Four times a day (QID) | ORAL | 0 refills | Status: DC | PRN

## 2015-10-24 MED ORDER — KETOROLAC TROMETHAMINE 30 MG/ML IJ SOLN
30.0000 mg | Freq: Once | INTRAMUSCULAR | Status: AC
Start: 2015-10-24 — End: 2015-10-24
  Administered 2015-10-24: 30 mg via INTRAVENOUS
  Filled 2015-10-24: qty 1

## 2015-10-24 MED ORDER — CYCLOBENZAPRINE HCL 10 MG PO TABS
10.0000 mg | ORAL_TABLET | Freq: Once | ORAL | Status: AC
  Administered 2015-10-24: 10 mg via ORAL
  Filled 2015-10-24: qty 1

## 2015-10-24 NOTE — ED Notes (Signed)
Patient rang call bell to inform us that her back pain was suddenly becoming worse. She states that her pain is currently a 9 on a scale of 0-10.

## 2015-10-24 NOTE — ED Notes (Signed)
Patient transported to CT 

## 2015-10-24 NOTE — ED Provider Notes (Signed)
[facility] Emergency Department Provider Note   ____________________________________________   First MD Initiated Contact with Patient 10/24/15 [phone]     (approximate)  I have reviewed the triage vital signs and the nursing notes.   HISTORY  Chief Complaint Flank Pain    HPI Nicole Sharp is a 65 y.o. female who comes into the hospital today with right-sided back pain. Which she reports started yesterday. The patient reports that the symptoms got worse last night. The patient did take some ibuprofen for pain but it didn't help. She reports that she's had some kidney stones in the past but has been a while. The patient denies any nausea or vomiting and denies any blood in her urine or foul smell. The patient did have some chills earlier but had no fever. She rates her pain a 9-10 out of 10 in intensity. The patient is here today for evaluation of these symptoms.   Past Medical History:  Diagnosis Date  . Allergic rhinitis   . Chronic kidney disease   . GERD (gastroesophageal reflux disease)   . History of chicken pox   . Hyperlipidemia     Patient Active Problem List   Diagnosis Date Noted  . Chest pain 05/28/2011  . Hyperlipidemia 05/28/2011  . Shortness of breath 05/28/2011    Past Surgical History:  Procedure Laterality Date  . ABDOMINAL HYSTERECTOMY    . BACK SURGERY    . CARDIAC CATHETERIZATION    . COLONOSCOPY WITH PROPOFOL N/A 07/13/2015   Procedure: COLONOSCOPY WITH PROPOFOL;  Surgeon: Lollie Sails, MD;  Location: [facility] ENDOSCOPY;  Service: Endoscopy;  Laterality: N/A;  . ESOPHAGOGASTRODUODENOSCOPY (EGD) WITH PROPOFOL N/A 07/13/2015   Procedure: ESOPHAGOGASTRODUODENOSCOPY (EGD) WITH PROPOFOL;  Surgeon: Lollie Sails, MD;  Location: [facility] ENDOSCOPY;  Service: Endoscopy;  Laterality: N/A;  . kidney stones removed    . LEFT HEART CATHETERIZATION WITH CORONARY ANGIOGRAM N/A 05/30/2011   Procedure: LEFT HEART CATHETERIZATION WITH  CORONARY ANGIOGRAM;  Surgeon: Minna Merritts, MD;  Location: [facility] CATH LAB;  Service: Cardiovascular;  Laterality: N/A;  . SEPTOPLASTY    . TONSILLECTOMY      Prior to Admission medications   Medication Sig Start Date End Date Taking? Authorizing Provider  aspirin 81 MG tablet Take 162 mg by mouth daily.     Historical Provider, MD  calcium carbonate (OSCAL) 1500 (600 Ca) MG TABS tablet Take 600 mg of elemental calcium by mouth daily with breakfast.    Historical Provider, MD  cyclobenzaprine (FLEXERIL) 10 MG tablet Take 1 tablet (10 mg total) by mouth 3 (three) times daily as needed for muscle spasms. 10/24/15   Loney Hering, MD  estradiol (CLIMARA - DOSED IN MG/24 HR) 0.05 mg/24hr Place 1 patch onto the skin once a week.    Historical Provider, MD  etodolac (LODINE) 400 MG tablet Take 400 mg by mouth 2 (two) times daily. Reported on 07/13/2015    Historical Provider, MD  Iron-Vitamin C (VITRON-C) 65-125 MG TABS Take 125 mg by mouth.    Historical Provider, MD  lidocaine (LIDODERM) 5 % Place 1 patch onto the skin every 12 (twelve) hours. Remove & Discard patch within 12 hours or as directed by MD 10/24/15 10/23/16  Loney Hering, MD  Multiple Vitamin (MULTIVITAMIN) tablet Take 1 tablet by mouth daily.    Historical Provider, MD  nitroGLYCERIN (NITROSTAT) 0.4 MG SL tablet Place 1 tablet (0.4 mg total) under the tongue every 5 (five) minutes as needed  for chest pain. 05/28/11 05/27/12  Minna Merritts, MD  omeprazole (PRILOSEC) 40 MG capsule Take 40 mg by mouth daily.    Historical Provider, MD  pantoprazole (PROTONIX) 40 MG tablet Take 40 mg by mouth daily. Reported on 07/13/2015    Historical Provider, MD  simvastatin (ZOCOR) 20 MG tablet Take 20 mg by mouth every evening.    Historical Provider, MD  sucralfate (CARAFATE) 1 g tablet Take 1 g by mouth 4 (four) times daily -  with meals and at bedtime.    Historical Provider, MD  traMADol (ULTRAM) 50 MG tablet Take 1 tablet (50 mg total) by  mouth every 6 (six) hours as needed. 10/24/15   Loney Hering, MD    Allergies Lipitor [atorvastatin] and Pravachol [pravastatin sodium]  No family history on file.  Social History Social History  Substance Use Topics  . Smoking status: Never Smoker  . Smokeless tobacco: Never Used  . Alcohol use No     Comment: Occas.    Review of Systems Constitutional: chills Eyes: No visual changes. ENT: No sore throat. Cardiovascular: Denies chest pain. Respiratory: Denies shortness of breath. Gastrointestinal: No abdominal pain.  No nausea, no vomiting.  No diarrhea.  No constipation. Genitourinary: Negative for dysuria. Musculoskeletal: Right sided low back pain. Skin: Negative for rash. Neurological: Negative for headaches, focal weakness or numbness.  10-point ROS otherwise negative.  ____________________________________________   PHYSICAL EXAM:  VITAL SIGNS: ED Triage Vitals [10/24/15 0102]  Enc Vitals Group     BP (!) 142/76     Pulse Rate 66     Resp 20     Temp 97.7 F (36.5 C)     Temp Source Oral     SpO2 100 %     Weight 170 lb (77.1 kg)     Height 5\' 7"  (1.702 m)     Head Circumference      Peak Flow      Pain Score 9     Pain Loc      Pain Edu?      Excl. in [place]?     Constitutional: Alert and oriented. Well appearing and in Moderate distress. Eyes: Conjunctivae are normal. PERRL. EOMI. Head: Atraumatic. Nose: No congestion/rhinnorhea. Mouth/Throat: Mucous membranes are moist.  Oropharynx non-erythematous. Cardiovascular: Normal rate, regular rhythm. Grossly normal heart sounds.  Good peripheral circulation. Respiratory: Normal respiratory effort.  No retractions. Lungs CTAB. Gastrointestinal: Soft and nontender. No distention.Right low back tenderness to palpation Musculoskeletal: No lower extremity tenderness nor edema.   Neurologic:  Normal speech and language.  Skin:  Skin is warm, dry and intact. Psychiatric: Mood and affect are normal.    ____________________________________________   LABS (all labs ordered are listed, but only abnormal results are displayed)  Labs Reviewed  CBC - Abnormal; Notable for the following:       Result Value   RDW 14.6 (*)    All other components within normal limits  BASIC METABOLIC PANEL - Abnormal; Notable for the following:    Glucose, Bld 105 (*)    Calcium 8.5 (*)    Anion gap 3 (*)    All other components within normal limits  URINALYSIS COMPLETEWITH MICROSCOPIC ([facility] ONLY) - Abnormal; Notable for the following:    Color, Urine STRAW (*)    APPearance CLEAR (*)    Bacteria, UA RARE (*)    Squamous Epithelial / LPF 0-5 (*)    All other components within normal limits   ____________________________________________  EKG  none ____________________________________________  RADIOLOGY  CT renal stone study ____________________________________________   PROCEDURES  Procedure(s) performed: None  Procedures  Critical Care performed: No  ____________________________________________   INITIAL IMPRESSION / ASSESSMENT AND PLAN / ED COURSE  Pertinent labs & imaging results that were available during my care of the patient were reviewed by me and considered in my medical decision making (see chart for details).  This is a 65 year old female who comes into the hospital today with some right-sided flank pain. The patient has a history of kidney stones and thinks she may have another kidney stone. I will give the patient a dose of morphine as well as a dose of Zofran and send the patient for a CT scan to evaluate for kidney stone.  Clinical Course  Value Comment By Time  CT Renal Stone Study 1. Multiple nonobstructing left nephrolithiasis. No right renal stones or obstructive uropathy to explain right flank pain. 2. Small hiatal hernia. 3. 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   Loney Hering, MD 10/10 [phone]   The patient's CT scan is  unremarkable. The patient did receive some Flexeril and a Lidoderm patch to her back to help with her pain which did improve but the pain is not fully gone. I will have the patient follow up with her primary care physician as well as orthopedic surgery. The patient will be discharged home to follow-up with her primary care physician. The patient has no further complaints or concerns at this time.  ____________________________________________   FINAL CLINICAL IMPRESSION(S) / ED DIAGNOSES  Final diagnoses:  Right flank pain  Acute right-sided low back pain without sciatica      NEW MEDICATIONS STARTED DURING THIS VISIT:  New Prescriptions   CYCLOBENZAPRINE (FLEXERIL) 10 MG TABLET    Take 1 tablet (10 mg total) by mouth 3 (three) times daily as needed for muscle spasms.   LIDOCAINE (LIDODERM) 5 %    Place 1 patch onto the skin every 12 (twelve) hours. Remove & Discard patch within 12 hours or as directed by MD   TRAMADOL (ULTRAM) 50 MG TABLET    Take 1 tablet (50 mg total) by mouth every 6 (six) hours as needed.     Note:  This document was prepared using Dragon voice recognition software and may include unintentional dictation errors.    Loney Hering, MD 10/24/15 [phone]

## 2015-10-24 NOTE — ED Triage Notes (Signed)
Patient ambulatory to triage with steady gait, without difficulty or distress noted; pt reports x 2 days having right flank pain with urinary frequency; st hx of stones

## 2015-10-24 NOTE — ED Notes (Signed)
IV insertion attempted x2 without success, another provider will attempt.

## 2015-10-24 NOTE — ED Notes (Signed)
Pt ambulates to restroom without difficulty, steady gait noted.

## 2016-06-13 ENCOUNTER — Other Ambulatory Visit: Payer: Self-pay | Admitting: Internal Medicine

## 2016-06-13 DIAGNOSIS — N6002 Solitary cyst of left breast: Secondary | ICD-10-CM

## 2016-06-13 DIAGNOSIS — Z1239 Encounter for other screening for malignant neoplasm of breast: Secondary | ICD-10-CM

## 2016-06-13 DIAGNOSIS — R922 Inconclusive mammogram: Secondary | ICD-10-CM

## 2016-06-18 ENCOUNTER — Ambulatory Visit
Admission: RE | Admit: 2016-06-18 | Discharge: 2016-06-18 | Disposition: A | Discharge status: Home / Self Care | Payer: Medicare Other | Source: Ambulatory Visit | Attending: Internal Medicine | Admitting: Internal Medicine

## 2016-06-18 DIAGNOSIS — N6002 Solitary cyst of left breast: Secondary | ICD-10-CM | POA: Insufficient documentation

## 2016-06-18 DIAGNOSIS — R922 Inconclusive mammogram: Secondary | ICD-10-CM

## 2016-06-18 DIAGNOSIS — Z1239 Encounter for other screening for malignant neoplasm of breast: Secondary | ICD-10-CM

## 2017-06-03 ENCOUNTER — Other Ambulatory Visit: Payer: Self-pay | Admitting: Internal Medicine

## 2017-06-03 DIAGNOSIS — Z1231 Encounter for screening mammogram for malignant neoplasm of breast: Secondary | ICD-10-CM

## 2017-06-19 ENCOUNTER — Ambulatory Visit
Admission: RE | Admit: 2017-06-19 | Discharge: 2017-06-19 | Disposition: A | Discharge status: Home / Self Care | Payer: Medicare Other | Source: Ambulatory Visit | Attending: Internal Medicine | Admitting: Internal Medicine

## 2017-06-19 DIAGNOSIS — Z1231 Encounter for screening mammogram for malignant neoplasm of breast: Secondary | ICD-10-CM | POA: Diagnosis present

## 2018-04-25 ENCOUNTER — Telehealth: Payer: 59 | Admitting: Nurse Practitioner

## 2018-04-25 DIAGNOSIS — M545 Low back pain, unspecified: Secondary | ICD-10-CM

## 2018-04-25 NOTE — Progress Notes (Signed)
Based on what you shared with me it looks like you could have just back pain, kidney stone or UTI.,that should be evaluated in a face to face office visit. Because I cannot properly diagnosis you according to your questionnaire answers then you need to be seen for proper treatment. You can go to [facility], [facility] , [place], inst[facility]are or urgent care for evaluation. All of th epossible covid patients are being routed to [facility] ER.   NOTE: If you entered your credit card information for this eVisit, you will not be charged. You may see a "hold" on your card for the $30 but that hold will drop off and you will not have a charge processed.  If you are having a true medical emergency please call 911.  If you need an urgent face to face visit, [place] has four urgent care centers for your convenience.  If you need care fast and have a high deductible or no insurance consider:   [email or web address] to reserve your spot online an avoid wait times  [facility] [address], [address] [place], [place] [address] 8 am to 8 pm Monday-Friday 10 am to 4 pm Saturday-Sunday *Across the street from [employer]  [place], [address] 8 am to 5 pm Monday-Friday * In the [facility] on the [facility]   The following sites will take your  insurance:  . [facility] [place] a Provider at this Location  [address] [place], [place] [address] . 10 am to 8 pm Monday-Friday . 12 pm to 8 pm Saturday-Sunday   . [facility] at [place] a Provider at this Location  [address] [place], [place], [place] [address] . 8 am to 8 pm Monday-Friday . 9 am to 6 pm Saturday . 11 am to 6 pm Sunday   . [facility] at [place] Get Driving Directions  [address] [place], [place] [address] . 8 am to 8 pm Monday-Friday . 8 am to 4 pm Saturday-Sunday   Your e-visit answers were reviewed by a board certified advanced clinical practitioner to complete your personal care plan.  6 minutes spent reviewing and documenting in chart.

## 2018-04-27 ENCOUNTER — Other Ambulatory Visit: Payer: Self-pay

## 2018-04-27 ENCOUNTER — Other Ambulatory Visit (HOSPITAL_COMMUNITY): Payer: Self-pay | Admitting: Internal Medicine

## 2018-04-27 ENCOUNTER — Ambulatory Visit
Admission: RE | Admit: 2018-04-27 | Discharge: 2018-04-27 | Disposition: A | Discharge status: Home / Self Care | Payer: Medicare Other | Source: Ambulatory Visit | Attending: Internal Medicine | Admitting: Internal Medicine

## 2018-04-27 ENCOUNTER — Other Ambulatory Visit: Payer: Self-pay | Admitting: Internal Medicine

## 2018-04-27 DIAGNOSIS — R109 Unspecified abdominal pain: Secondary | ICD-10-CM | POA: Diagnosis present

## 2018-05-06 ENCOUNTER — Other Ambulatory Visit: Payer: Self-pay | Admitting: Internal Medicine

## 2018-05-06 DIAGNOSIS — Z1231 Encounter for screening mammogram for malignant neoplasm of breast: Secondary | ICD-10-CM

## 2018-06-29 ENCOUNTER — Ambulatory Visit: Payer: 59

## 2018-07-06 ENCOUNTER — Other Ambulatory Visit: Payer: Self-pay

## 2018-07-06 ENCOUNTER — Ambulatory Visit
Admission: RE | Admit: 2018-07-06 | Discharge: 2018-07-06 | Disposition: A | Discharge status: Home / Self Care | Payer: Medicare Other | Source: Ambulatory Visit | Attending: Internal Medicine | Admitting: Internal Medicine

## 2018-07-06 DIAGNOSIS — Z1231 Encounter for screening mammogram for malignant neoplasm of breast: Secondary | ICD-10-CM | POA: Insufficient documentation

## 2019-02-03 ENCOUNTER — Ambulatory Visit: Payer: Medicare Other | Attending: Internal Medicine

## 2019-02-03 DIAGNOSIS — Z23 Encounter for immunization: Secondary | ICD-10-CM | POA: Insufficient documentation

## 2019-02-03 NOTE — Progress Notes (Signed)
   Covid-19 Vaccination Clinic  Name:  Nicole Sharp    MRN: [id number] DOB: [birth year]  02/03/2019  [name] was observed post Covid-19 immunization for 15 minutes without incidence. She was provided with Vaccine Information Sheet and instruction to access the V-Safe system.   [name] was instructed to call 911 with any severe reactions post vaccine: Marland Kitchen Difficulty breathing  . Swelling of your face and throat  . A fast heartbeat  . A bad rash all over your body  . Dizziness and weakness    Immunizations Administered    Name Date Dose VIS Date Route   Pfizer COVID-19 Vaccine 02/03/2019 10:14 AM 0.3 mL 12/25/2018 Intramuscular   Manufacturer: [place]   Lot: [id number]   [place]: [id number]

## 2019-02-22 ENCOUNTER — Ambulatory Visit: Payer: Medicare HMO | Attending: Internal Medicine

## 2019-02-22 DIAGNOSIS — Z23 Encounter for immunization: Secondary | ICD-10-CM | POA: Insufficient documentation

## 2019-02-22 NOTE — Progress Notes (Signed)
   Covid-19 Vaccination Clinic  Name:  Nicole Sharp    MRN: [id number] DOB: 1950/02/16  02/22/2019  Nicole Sharp was observed post Covid-19 immunization for 15 minutes without incidence. She was provided with Vaccine Information Sheet and instruction to access the V-Safe system.   Nicole Sharp was instructed to call 911 with any severe reactions post vaccine: Marland Kitchen Difficulty breathing  . Swelling of your face and throat  . A fast heartbeat  . A bad rash all over your body  . Dizziness and weakness    Immunizations Administered    Name Date Dose VIS Date Route   Pfizer COVID-19 Vaccine 02/22/2019 12:24 PM 0.3 mL 12/25/2018 Intramuscular   Manufacturer: [place]   Lot: [id number]   [place]: [id number]

## 2019-05-19 ENCOUNTER — Other Ambulatory Visit: Payer: Self-pay | Admitting: Internal Medicine

## 2019-05-19 DIAGNOSIS — M5116 Intervertebral disc disorders with radiculopathy, lumbar region: Secondary | ICD-10-CM

## 2019-06-17 ENCOUNTER — Other Ambulatory Visit: Payer: Self-pay | Admitting: Internal Medicine

## 2019-06-17 DIAGNOSIS — Z1231 Encounter for screening mammogram for malignant neoplasm of breast: Secondary | ICD-10-CM

## 2019-07-12 ENCOUNTER — Other Ambulatory Visit: Payer: Self-pay

## 2019-07-12 ENCOUNTER — Ambulatory Visit
Admission: RE | Admit: 2019-07-12 | Discharge: 2019-07-12 | Disposition: A | Discharge status: Home / Self Care | Payer: Medicare HMO | Source: Ambulatory Visit | Attending: Internal Medicine | Admitting: Internal Medicine

## 2019-07-12 DIAGNOSIS — Z1231 Encounter for screening mammogram for malignant neoplasm of breast: Secondary | ICD-10-CM | POA: Insufficient documentation

## 2019-07-12 HISTORY — DX: Malignant (primary) neoplasm, unspecified: C80.1

## 2019-12-20 ENCOUNTER — Other Ambulatory Visit: Payer: Self-pay | Admitting: Internal Medicine

## 2019-12-20 DIAGNOSIS — R1084 Generalized abdominal pain: Secondary | ICD-10-CM

## 2019-12-20 DIAGNOSIS — D500 Iron deficiency anemia secondary to blood loss (chronic): Secondary | ICD-10-CM

## 2020-01-27 ENCOUNTER — Other Ambulatory Visit: Admission: RE | Admit: 2020-01-27 | Payer: Medicare HMO | Source: Ambulatory Visit

## 2020-01-31 ENCOUNTER — Ambulatory Visit: Admission: RE | Admit: 2020-01-31 | Payer: Medicare HMO | Source: Home / Self Care | Admitting: Internal Medicine

## 2020-01-31 ENCOUNTER — Encounter: Admission: RE | Payer: Self-pay | Source: Home / Self Care

## 2020-01-31 SURGERY — COLONOSCOPY WITH PROPOFOL
Anesthesia: General

## 2020-07-12 ENCOUNTER — Other Ambulatory Visit: Payer: Self-pay | Admitting: Internal Medicine

## 2020-07-12 DIAGNOSIS — Z1231 Encounter for screening mammogram for malignant neoplasm of breast: Secondary | ICD-10-CM

## 2020-08-01 ENCOUNTER — Other Ambulatory Visit: Payer: Self-pay

## 2020-08-01 ENCOUNTER — Ambulatory Visit
Admission: RE | Admit: 2020-08-01 | Discharge: 2020-08-01 | Disposition: A | Discharge status: Home / Self Care | Payer: Medicare HMO | Source: Ambulatory Visit | Attending: Internal Medicine | Admitting: Internal Medicine

## 2020-08-01 DIAGNOSIS — Z1231 Encounter for screening mammogram for malignant neoplasm of breast: Secondary | ICD-10-CM | POA: Diagnosis present

## 2021-07-20 ENCOUNTER — Other Ambulatory Visit: Payer: Self-pay | Admitting: Internal Medicine

## 2021-07-20 DIAGNOSIS — Z1231 Encounter for screening mammogram for malignant neoplasm of breast: Secondary | ICD-10-CM

## 2021-08-21 ENCOUNTER — Ambulatory Visit
Admission: RE | Admit: 2021-08-21 | Discharge: 2021-08-21 | Disposition: A | Discharge status: Home / Self Care | Payer: Medicare HMO | Source: Ambulatory Visit | Attending: Internal Medicine | Admitting: Internal Medicine

## 2021-08-21 DIAGNOSIS — Z1231 Encounter for screening mammogram for malignant neoplasm of breast: Secondary | ICD-10-CM | POA: Diagnosis present

## 2022-01-11 IMAGING — MG MM DIGITAL SCREENING BILAT W/ TOMO AND CAD
8 series · 8 of 24 positions shown · non-contrast
Comparison: Previous exam(s).

CLINICAL DATA: Screening.

EXAM:
DIGITAL SCREENING BILATERAL MAMMOGRAM WITH TOMOSYNTHESIS AND CAD
TECHNIQUE: Bilateral screening digital craniocaudal and mediolateral oblique
mammograms were obtained. Bilateral screening digital breast
tomosynthesis was performed. The images were evaluated with
computer-aided detection.

[L MLO synth-2D]
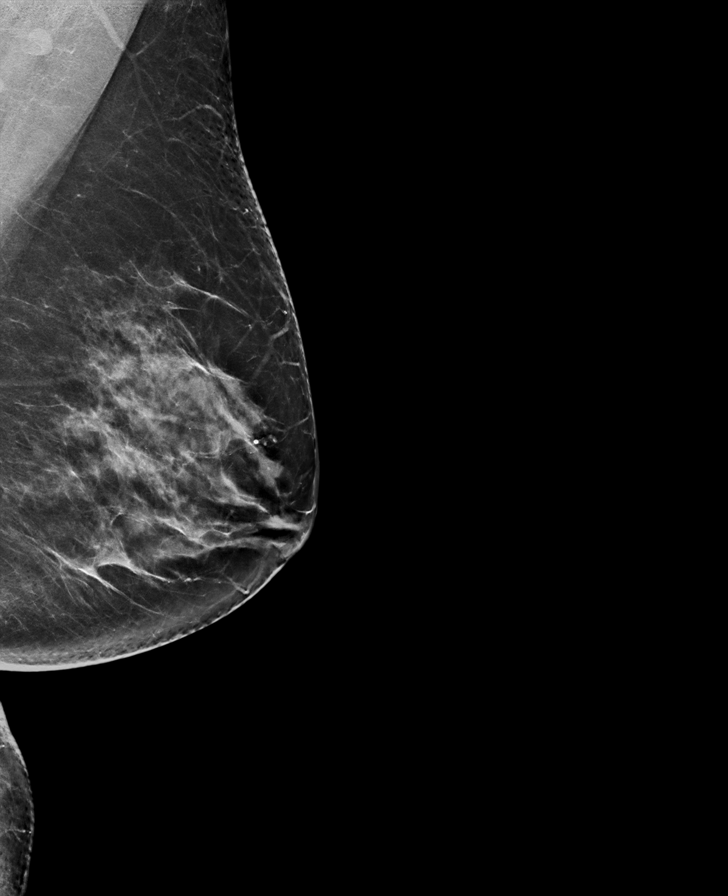

[R CC synth-2D]
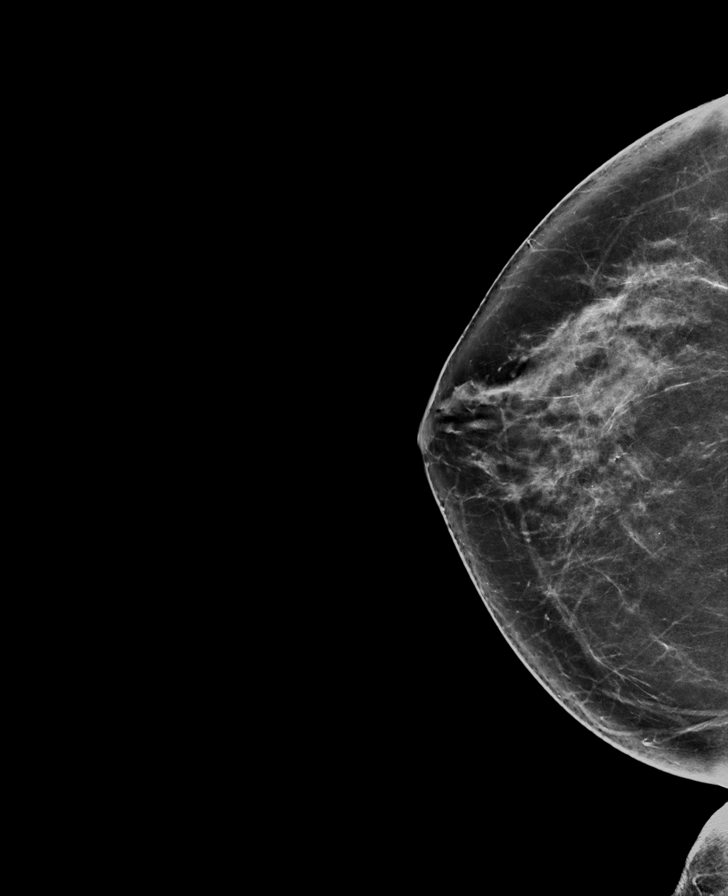

[R MLO synth-2D]
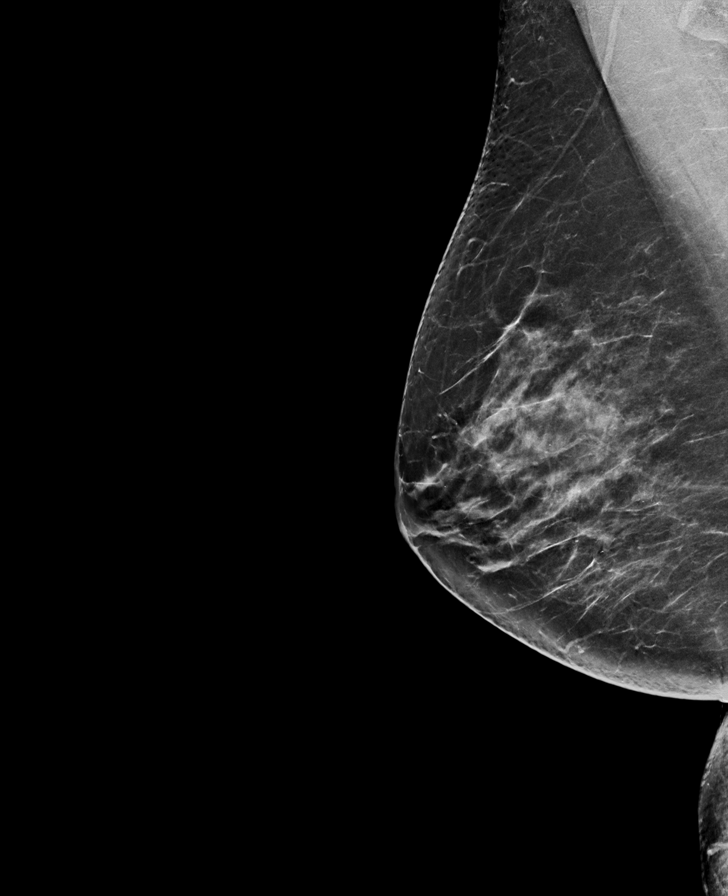

[L CC synth-2D]
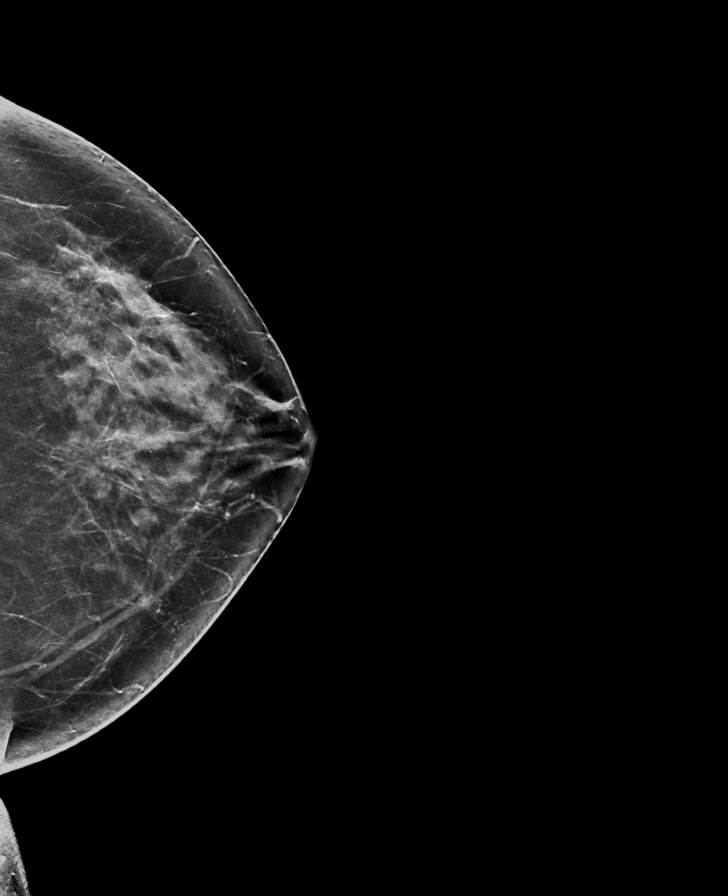

[R MLO tomo · tomo slice 40/79.0]
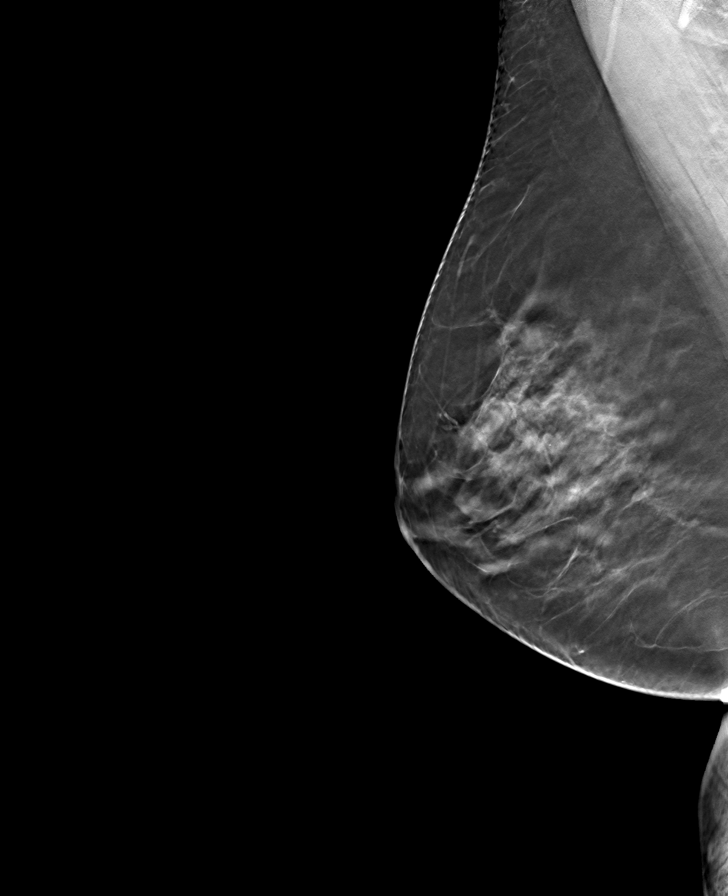

[R CC tomo · tomo slice 39/77.0]
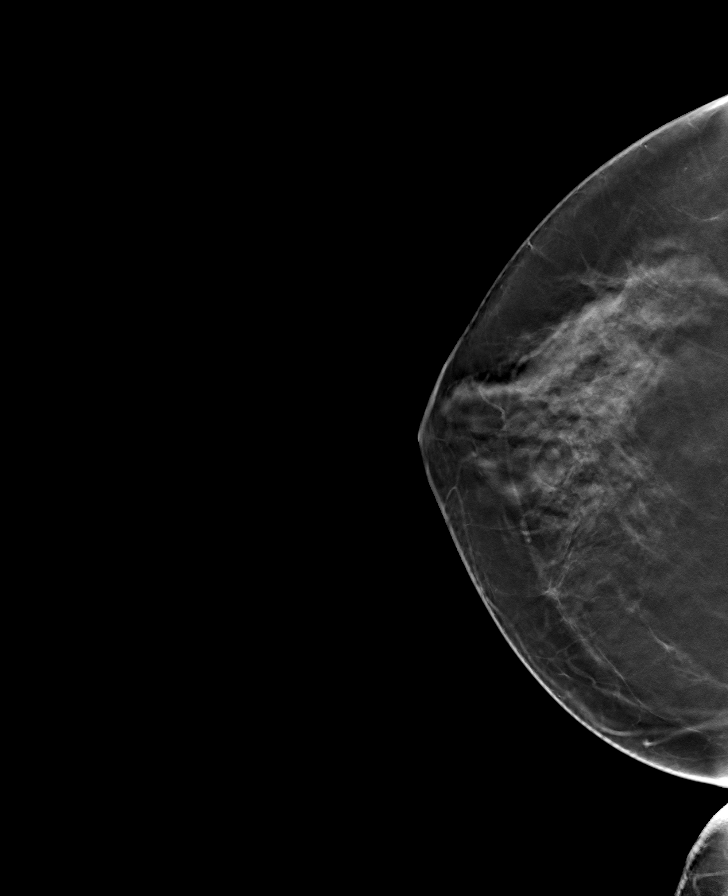

[L CC tomo · tomo slice 39/76.0]
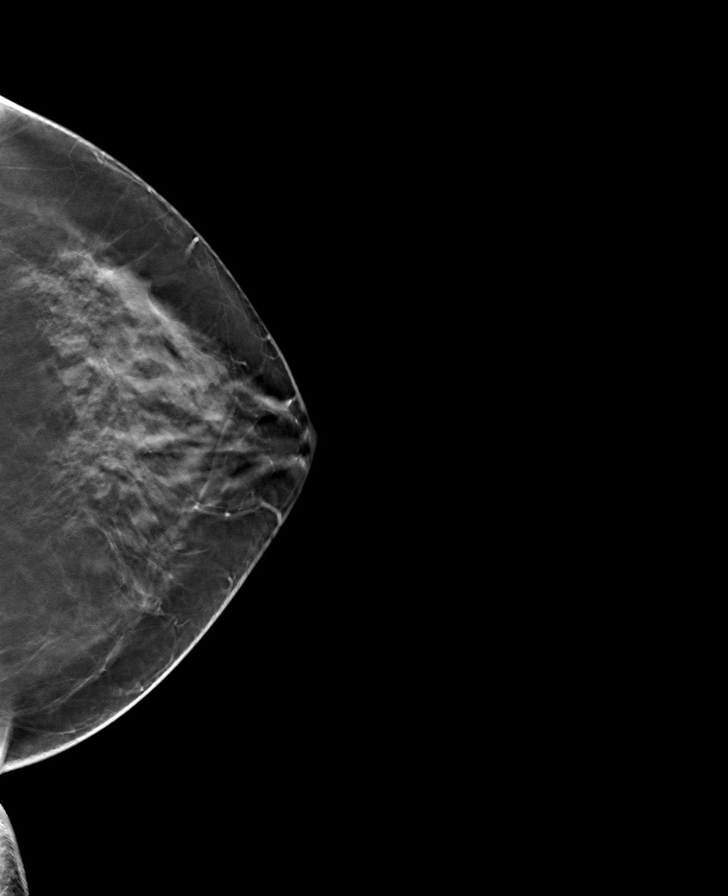

[L MLO tomo · tomo slice 40/79.0]
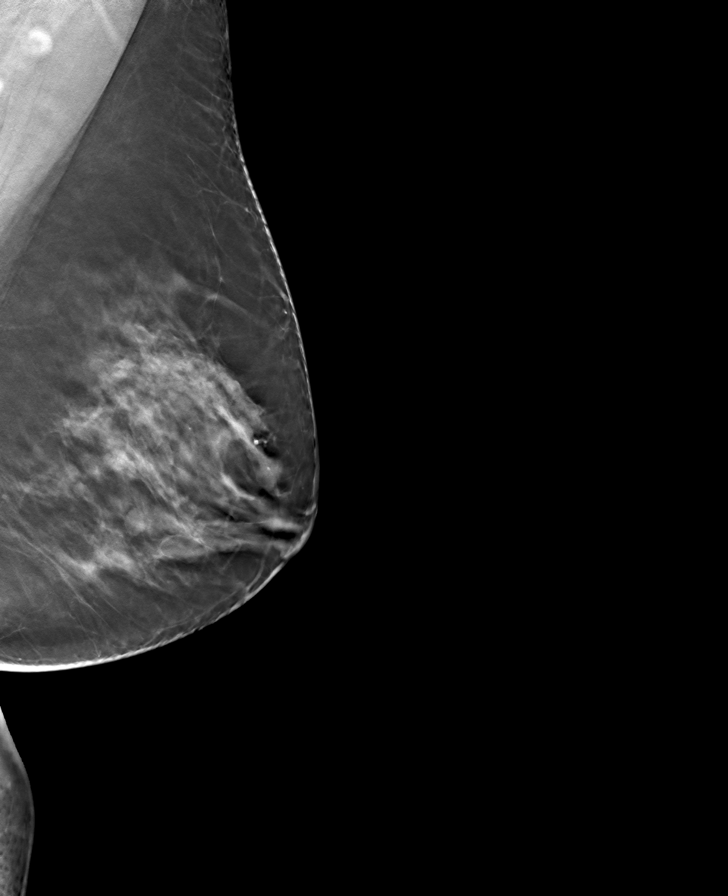

[8 of 24 positions shown; findings below may reference images not displayed]

ACR Breast Density Category c: The breast tissue is heterogeneously
dense, which may obscure small masses.
FINDINGS: There are no findings suspicious for malignancy.
IMPRESSION: No mammographic evidence of malignancy. A result letter of this
screening mammogram will be mailed directly to the patient.

RECOMMENDATION:
Screening mammogram in one year. (Code:Q3-W-BC3)

BI-RADS CATEGORY  1: Negative.

## 2022-02-09 NOTE — Progress Notes (Unsigned)
Cardiology Office Note  Date:  02/11/2022   ID:  Nicole Sharp Nicole Sharp, MRN [id number]  PCP:  Rusty Aus, MD   Chief Complaint  Patient presents with   New Patient (Initial Visit)    Patient c/o palpitations, lightheaded at times, fluttering in chest that may last about 25 minutes, heart racing and irregular heartbeats at times. Medications reviewed by the patient verbally.      HPI:  Nicole Sharp is a  72 year old woman, patient of Dr. Emily Filbert, with history of  hyperlipidemia,  Anemia Previously evaluated by myself in 2013 for chest pain Who presents by referral from Dr. Sabra Heck for palpitations  On discussions today, reports she is very active, frequently plays tennis Denies significant chest pain or shortness of breath on exertion No orthopnea, PND, no significant lower extremity swelling  Recent palpitations over the past several weeks Keeping a loose diary, this past week with frequent episodes of palpitations Thursday: 2 episodes: flutter, lasting less than 30 seconds Friday 2 episodes Saturday: 25 min palpitations Has also appreciated more fatigue, feels tired Denies significant changes in her sleep  History of hyperlipidemia  unable to tolerate several statins.  Tolerating simvastatin 20 mg   Family history  mother has history of CVA at age 79, f ather died of colon cancer at age 77  Lab work reviewed Hemoglobin 15.8 A1c 5.7 Total cholesterol 228 LDL 143  CT renal stone study April 2020, images pulled up and reviewed by myself  mild abdominal aortic atherosclerosis noted  EKG personally reviewed by myself on todays visit Normal sinus rhythm rate 76 bpm no significant ST-T wave changes  PMH:   has a past medical history of Allergic rhinitis, Cancer ([place]), Chronic kidney disease, GERD (gastroesophageal reflux disease), History of chicken pox, and Hyperlipidemia.  PSH:    Past Surgical History:  Procedure Laterality Date   ABDOMINAL  HYSTERECTOMY     BACK SURGERY     BREAST BIOPSY Left 1985   neg needle bx   CARDIAC CATHETERIZATION     COLONOSCOPY WITH PROPOFOL N/A 07/13/2015   Procedure: COLONOSCOPY WITH PROPOFOL;  Surgeon: Lollie Sails, MD;  Location: [facility] ENDOSCOPY;  Service: Endoscopy;  Laterality: N/A;   ESOPHAGOGASTRODUODENOSCOPY (EGD) WITH PROPOFOL N/A 07/13/2015   Procedure: ESOPHAGOGASTRODUODENOSCOPY (EGD) WITH PROPOFOL;  Surgeon: Lollie Sails, MD;  Location: [facility] ENDOSCOPY;  Service: Endoscopy;  Laterality: N/A;   kidney stones removed     LEFT HEART CATHETERIZATION WITH CORONARY ANGIOGRAM N/A 05/30/2011   Procedure: LEFT HEART CATHETERIZATION WITH CORONARY ANGIOGRAM;  Surgeon: Minna Merritts, MD;  Location: [place] CATH LAB;  Service: Cardiovascular;  Laterality: N/A;   SEPTOPLASTY     TONSILLECTOMY      Current Outpatient Medications  Medication Sig Dispense Refill   calcium carbonate (OSCAL) 1500 (600 Ca) MG TABS tablet Take 600 mg of elemental calcium by mouth daily with breakfast.     estradiol (ESTRACE) 0.5 MG tablet Take 0.5 mg by mouth daily.     Iron-Vitamin C (VITRON-C) 65-125 MG TABS Take 125 mg by mouth.     Multiple Vitamin (MULTIVITAMIN) tablet Take 1 tablet by mouth daily.     pantoprazole (PROTONIX) 40 MG tablet Take 40 mg by mouth daily. Reported on 07/13/2015     simvastatin (ZOCOR) 20 MG tablet Take 20 mg by mouth every evening.     cyclobenzaprine (FLEXERIL) 10 MG tablet Take 1 tablet (10 mg total) by mouth 3 (three) times daily as needed for  muscle spasms. (Patient not taking: Reported on 02/11/2022) 12 tablet 0   estradiol (CLIMARA - DOSED IN MG/24 HR) 0.05 mg/24hr Place 1 patch onto the skin once a week. (Patient not taking: Reported on 02/11/2022)     traMADol (ULTRAM) 50 MG tablet Take 1 tablet (50 mg total) by mouth every 6 (six) hours as needed. (Patient not taking: Reported on 02/11/2022) 12 tablet 0   No current facility-administered medications for this visit.     Allergies:    Atorvastatin, Pravastatin, and Pravastatin sodium   Social History:  The patient  reports that she has never smoked. She has never used smokeless tobacco. She reports that she does not drink alcohol and does not use drugs.   Family History:   family history is not on file.    Review of Systems: Review of Systems  Constitutional: Negative.   HENT: Negative.    Respiratory: Negative.    Cardiovascular:  Positive for palpitations.  Gastrointestinal: Negative.   Musculoskeletal: Negative.   Neurological: Negative.   Psychiatric/Behavioral: Negative.    All other systems reviewed and are negative.  PHYSICAL EXAM: VS:  BP 126/86 (BP Location: Right Arm, Patient Position: Sitting, Cuff Size: Normal)   Pulse 76   Ht '5\' 6"'$  (1.676 m)   Wt 188 lb 6 oz (85.4 kg)   SpO2 95%   BMI 30.40 kg/m  , BMI Body mass index is 30.4 kg/m. Constitutional:  oriented to person, place, and time. No distress.  HENT:  Head: Grossly normal Eyes:  no discharge. No scleral icterus.  Neck: No JVD, no carotid bruits  Cardiovascular: Regular rate and rhythm, no murmurs appreciated Pulmonary/Chest: Clear to auscultation bilaterally, no wheezes or rails Abdominal: Soft.  no distension.  no tenderness.  Musculoskeletal: Normal range of motion Neurological:  normal muscle tone. Coordination normal. No atrophy Skin: Skin warm and dry Psychiatric: normal affect, pleasant  Recent Labs: No results found for requested labs within last 365 days.    Lipid Panel No results found for: "CHOL", "HDL", "LDLCALC", "TRIG"    Wt Readings from Last 3 Encounters:  02/11/22 188 lb 6 oz (85.4 kg)  10/24/15 170 lb (77.1 kg)  07/13/15 168 lb (76.2 kg)      ASSESSMENT AND PLAN:  Problem List Items Addressed This Visit       Cardiology Problems   Hyperlipidemia     Other   Chest pain of uncertain etiology   Other Visit Diagnoses     Palpitations    -  Primary   Aortic atherosclerosis (HCC)           Palpitations Etiology unclear, unable to exclude atrial fibrillation Paroxysmal in nature, presenting typically at rest, does not appreciate them as much on exertion such as when she is playing tennis Initially symptoms less than 30 seconds, more recently lasting up to 25 minutes Concerned given family history atrial fibrillation We have ordered a Zio monitor for further evaluation Also provided prescription for propranolol 20 mg 3 times daily as needed for persistent palpitations  Aortic atherosclerosis Mild atherosclerosis distal aorta on CT scan images 2020  Hyperlipidemia Total cholesterol 220 up to 270 over the past several years on simvastatin 20 Recommend we add Zetia 10 mg daily with simvastatin If inadequate improvement in numbers, may need to try PCSK9 inhibitor  Chest pain Remote history of chest pain discomfort, reports no recent symptoms concerning for angina   Total encounter time more than 50 minutes  Greater than 50% was  spent in counseling and coordination of care with the patient    Signed, Esmond Plants, M.D., Ph.D. [place], [place]

## 2022-02-11 ENCOUNTER — Encounter: Payer: Self-pay | Admitting: Cardiovascular Disease

## 2022-02-11 ENCOUNTER — Ambulatory Visit: Payer: Medicare HMO | Attending: Cardiovascular Disease | Admitting: Cardiovascular Disease

## 2022-02-11 ENCOUNTER — Ambulatory Visit (INDEPENDENT_AMBULATORY_CARE_PROVIDER_SITE_OTHER): Payer: Medicare HMO

## 2022-02-11 VITALS — BP 126/86 | HR 76 | Ht 66.0 in | Wt 188.4 lb

## 2022-02-11 DIAGNOSIS — I700 Atherosclerosis of aorta: Secondary | ICD-10-CM | POA: Diagnosis not present

## 2022-02-11 DIAGNOSIS — R079 Chest pain, unspecified: Secondary | ICD-10-CM

## 2022-02-11 DIAGNOSIS — R002 Palpitations: Secondary | ICD-10-CM

## 2022-02-11 DIAGNOSIS — E782 Mixed hyperlipidemia: Secondary | ICD-10-CM | POA: Diagnosis not present

## 2022-02-11 MED ORDER — PROPRANOLOL HCL 20 MG PO TABS: 20.0000 mg | ORAL_TABLET | Freq: Three times a day (TID) | ORAL | 1 refills | Status: AC | PRN

## 2022-02-11 MED ORDER — EZETIMIBE 10 MG PO TABS: 10.0000 mg | ORAL_TABLET | Freq: Every day | ORAL | 3 refills | Status: AC

## 2022-02-11 NOTE — Patient Instructions (Addendum)
Medication Instructions:  Please take propranolol 20 up to 3x a day as needed Please start zetia 10 mg daily  If you need a refill on your cardiac medications before your next appointment, please call your pharmacy.   Lab work: No new labs needed  Testing/Procedures: Your physician has recommended that you wear a Zio monitor to evaluate palpitations.   This monitor is a medical device that records the heart's electrical activity. Doctors most often use these monitors to diagnose arrhythmias. Arrhythmias are problems with the speed or rhythm of the heartbeat. The monitor is a small device applied to your chest. You can wear one while you do your normal daily activities. While wearing this monitor if you have any symptoms to push the button and record what you felt. Once you have worn this monitor for the period of time provider prescribed (Usually 14 days), you will return the monitor device in the postage paid box. Once it is returned they will download the data collected and provide [place] with a report which the provider will then review and we will call you with those results. Important tips:  Avoid showering during the first 24 hours of wearing the monitor. Avoid excessive sweating to help maximize wear time. Do not submerge the device, no hot tubs, and no swimming pools. Keep any lotions or oils away from the patch. After 24 hours you may shower with the patch on. Take brief showers with your back facing the shower head.  Do not remove patch once it has been placed because that will interrupt data and decrease adhesive wear time. Push the button when you have any symptoms and write down what you were feeling. Once you have completed wearing your monitor, remove and place into box which has postage paid and place in your outgoing mailbox.  If for some reason you have misplaced your box then call our office and we can provide another box and/or mail it off for you.  Follow-Up: At [facility], you and your health needs are our priority.  As part of our continuing mission to provide you with exceptional heart care, we have created designated Provider Care Teams.  These Care Teams include your primary Cardiologist (physician) and Advanced Practice Providers (APPs -  Physician Assistants and Nurse Practitioners) who all work together to provide you with the care you need, when you need it.  You will need a follow up appointment as needed  Providers on your designated Care Team:   Murray Hodgkins, NP Christell Faith, PA-C Cadence Kathlen Mody, [place]  COVID-19 Vaccine Information can be found at: [email or web address] For questions related to vaccine distribution or appointments, please email [email or web address] or call [phone].

## 2022-02-13 DIAGNOSIS — R002 Palpitations: Secondary | ICD-10-CM

## 2022-03-08 ENCOUNTER — Telehealth: Payer: Self-pay | Admitting: Cardiovascular Disease

## 2022-03-08 NOTE — Telephone Encounter (Signed)
Spoke with the patient and she stated that she mailed back her Zio monitor and is anxious for the results. Informed patient that as soon as the results are read they would be given to her. Patient was understanding just anxious to know.

## 2022-03-08 NOTE — Telephone Encounter (Signed)
Pt is requesting call back to get update for heart monitor.

## 2022-08-13 ENCOUNTER — Other Ambulatory Visit: Payer: Self-pay | Admitting: Internal Medicine

## 2022-08-13 DIAGNOSIS — Z1231 Encounter for screening mammogram for malignant neoplasm of breast: Secondary | ICD-10-CM

## 2022-09-10 ENCOUNTER — Ambulatory Visit
Admission: RE | Admit: 2022-09-10 | Discharge: 2022-09-10 | Disposition: A | Discharge status: Home / Self Care | Payer: Medicare HMO | Source: Ambulatory Visit | Attending: Internal Medicine | Admitting: Internal Medicine

## 2022-09-10 DIAGNOSIS — Z1231 Encounter for screening mammogram for malignant neoplasm of breast: Secondary | ICD-10-CM | POA: Insufficient documentation

## 2022-09-20 ENCOUNTER — Encounter: Payer: Medicare HMO | Admitting: Urology

## 2023-06-13 ENCOUNTER — Encounter: Payer: Self-pay | Admitting: Emergency Medicine

## 2023-06-13 ENCOUNTER — Ambulatory Visit
Admission: EM | Admit: 2023-06-13 | Discharge: 2023-06-13 | Disposition: A | Discharge status: Home / Self Care | Attending: Family Medicine | Admitting: Family Medicine

## 2023-06-13 DIAGNOSIS — H9201 Otalgia, right ear: Secondary | ICD-10-CM | POA: Diagnosis not present

## 2023-06-13 MED ORDER — AZITHROMYCIN 250 MG PO TABS: ORAL_TABLET | ORAL | 0 refills | Status: AC

## 2023-06-13 MED ORDER — FLUCONAZOLE 150 MG PO TABS: 150.0000 mg | ORAL_TABLET | Freq: Once | ORAL | 0 refills | Status: AC

## 2023-06-13 NOTE — ED Provider Notes (Signed)
 [facility]    CSN: [id number] Arrival date & time: 06/13/23  1226      History   Chief Complaint Chief Complaint  Patient presents with   Otalgia    HPI Nicole Sharp is a 73 y.o. female.   HPI   Nicole Sharp presents for right ear pain with associated hearing loss for the past 2 days.  The night before she heard something buzzing near her ear. She put some peroxide in the ear. No cold symptoms, fever, headache.      Past Medical History:  Diagnosis Date   Allergic rhinitis    Cancer (HCC)    basal cell skin ca   Chronic kidney disease    GERD (gastroesophageal reflux disease)    History of chicken pox    Hyperlipidemia     Patient Active Problem List   Diagnosis Date Noted   Chest pain of uncertain etiology 05/28/2011   Hyperlipidemia 05/28/2011   Shortness of breath 05/28/2011    Past Surgical History:  Procedure Laterality Date   ABDOMINAL HYSTERECTOMY     BACK SURGERY     BREAST BIOPSY Left 1985   neg needle bx   CARDIAC CATHETERIZATION     COLONOSCOPY WITH PROPOFOL  N/A 07/13/2015   Procedure: COLONOSCOPY WITH PROPOFOL ;  Surgeon: Deveron Fly, MD;  Location: [facility] ENDOSCOPY;  Service: Endoscopy;  Laterality: N/A;   ESOPHAGOGASTRODUODENOSCOPY (EGD) WITH PROPOFOL  N/A 07/13/2015   Procedure: ESOPHAGOGASTRODUODENOSCOPY (EGD) WITH PROPOFOL ;  Surgeon: Deveron Fly, MD;  Location: [facility] ENDOSCOPY;  Service: Endoscopy;  Laterality: N/A;   kidney stones removed     LEFT HEART CATHETERIZATION WITH CORONARY ANGIOGRAM N/A 05/30/2011   Procedure: LEFT HEART CATHETERIZATION WITH CORONARY ANGIOGRAM;  Surgeon: Devorah Fonder, MD;  Location: [facility] CATH LAB;  Service: Cardiovascular;  Laterality: N/A;   SEPTOPLASTY     TONSILLECTOMY      OB History   No obstetric history on file.      Home Medications    Prior to Admission medications   Medication Sig Start Date End Date Taking? Authorizing Provider  azithromycin  (ZITHROMAX  Z-PAK) 250 MG  tablet Take 2 tablets on day 1 then 1 tablet daily 06/13/23  Yes Finola Rosal, DO  Calcium Carb-Cholecalciferol (CALCIUM HIGH POTENCY/VITAMIN D) 600-5 MG-MCG TABS Take 1 tablet by mouth daily.    [provider]  calcium carbonate (OSCAL) 1500 (600 Ca) MG TABS tablet Take 600 mg of elemental calcium by mouth daily with breakfast.    [provider]  Cyanocobalamin 3000 MCG SUBL Place 1 lozenge under the tongue.    [provider]  cycloSPORINE (RESTASIS) 0.05 % ophthalmic emulsion     [provider]  estradiol (ESTRACE) 0.5 MG tablet Take 0.5 mg by mouth daily. 07/18/21   [provider]  ezetimibe  (ZETIA ) 10 MG tablet Take 1 tablet (10 mg total) by mouth daily. 02/11/22   Gollan, Timothy J, MD  Iron-Vitamin C (VITRON-C) 65-125 MG TABS Take 125 mg by mouth.    [provider]  Multiple Vitamin (MULTIVITAMIN) tablet Take 1 tablet by mouth daily.    [provider]  pantoprazole (PROTONIX) 40 MG tablet Take 40 mg by mouth daily. Reported on 07/13/2015    [provider]  propranolol  (INDERAL ) 20 MG tablet Take 1 tablet (20 mg total) by mouth 3 (three) times daily as needed (palpitations). 02/11/22   Gollan, Timothy J, MD  simvastatin (ZOCOR) 20 MG tablet Take 20 mg by mouth every evening.  [provider]    Family History Family History  Problem Relation Age of Onset   Stroke Mother    Breast cancer Neg Hx     Social History Social History   Tobacco Use   Smoking status: Never   Smokeless tobacco: Never  Vaping Use   Vaping status: Never Used  Substance Use Topics   Alcohol use: No    Comment: Occas.   Drug use: No     Allergies   Atorvastatin, Pravastatin, and Pravastatin sodium   Review of Systems Review of Systems: :negative unless otherwise stated in HPI.      Physical Exam Triage Vital Signs ED Triage Vitals  Encounter Vitals Group     BP      Systolic BP Percentile      Diastolic  BP Percentile      Pulse      Resp      Temp      Temp src      SpO2      Weight      Height      Head Circumference      Peak Flow      Pain Score      Pain Loc      Pain Education      Exclude from Growth Chart    No data found.  Updated Vital Signs BP 133/78 (BP Location: Left Arm)   Pulse 74   Temp 97.9 F (36.6 C) (Oral)   Resp 18   SpO2 96%   Visual Acuity Right Eye Distance:   Left Eye Distance:   Bilateral Distance:    Right Eye Near:   Left Eye Near:    Bilateral Near:     Physical Exam GEN:     alert, non-toxic appearing female in no distress    HENT:  mucus membranes moist, no nasal discharge, right TM not visible due to cerumen impaction, left TM normal, normal external auditory canals bilaterally, nontender tragus EYES:   no scleral injection or discharge  NECK:  normal ROM, no lymphadenopathy, no meningismus   RESP:  no increased work of breathing, clear to auscultation bilaterally,   CVS:   regular rate and rhythm Skin:   warm and dry, no rash on visible skin, normal skin turgor    UC Treatments / Results  Labs (all labs ordered are listed, but only abnormal results are displayed) Labs Reviewed - No data to display  EKG   Radiology No results found.  Procedures Procedures (including critical care time) Procedures Ear Cerumen Removal   Date/Time: @DATE @ 1:27 PM  Performed by: nursing staff Authorized by: Fidel Huddle, DO   Consent:    Consent obtained:  Verbal   Consent given by:  Patient   Risks discussed:  Bleeding, dizziness, infection, incomplete removal, TM perforation and pain   Alternatives discussed:  No treatment Procedure details:    Location:  Right ear   Procedure type: irrigation   Post-procedure details:    Inspection:  TM intact bulging and erythematous   Hearing quality:  Improved   Patient tolerance of procedure:  Tolerated well, no immediate complications  Medications Ordered in UC Medications - No data  to display  Initial Impression / Assessment and Plan / UC Course  I have reviewed the triage vital signs and the nursing notes.  Pertinent labs & imaging results that were available during my care of the patient were reviewed by me and considered in my  medical decision making (see chart for details).       Pt is a 73 y.o. female with 2 days of hearing changes and right ear pain. Ceruminosis is noted on the right. Wax is removed by syringing debridement.  On reassessment, TM with erythema and bulging.  Will treat with azithromycin  as she recalls an adverse reaction to taking amoxicillin but she is unsure.  No purulent discharge in canal. Doubt acute otitis media or externa. Hearing has improved. Instructions for home care to prevent wax buildup are given.  Discussed MDM, treatment plan and plan for follow-up with patient who agrees with plan.    Final Clinical Impressions(s) / UC Diagnoses   Final diagnoses:  Otalgia of right ear     Discharge Instructions      Stop by the pharmacy to pick up your prescriptions.  Follow up with your primary care provider or return to the urgent care, if not improving.   Stop by the pharmacy to pick up Debrox for earwax removal.      ED Prescriptions     Medication Sig Dispense Auth. Provider   azithromycin  (ZITHROMAX  Z-PAK) 250 MG tablet Take 2 tablets on day 1 then 1 tablet daily 6 tablet Berneda Piccininni, DO   fluconazole  (DIFLUCAN ) 150 MG tablet Take 1 tablet (150 mg total) by mouth once for 1 dose. 1 tablet Fidel Huddle, DO      PDMP not reviewed this encounter.   Fidel Huddle, DO 06/14/23 1327

## 2023-06-13 NOTE — Discharge Instructions (Signed)
 Stop by the pharmacy to pick up your prescriptions.  Follow up with your primary care provider or return to the urgent care, if not improving.   Stop by the pharmacy to pick up Debrox for earwax removal.

## 2023-06-13 NOTE — ED Triage Notes (Signed)
Patient presents with c/o right ear pain x 2 days

## 2023-09-03 ENCOUNTER — Other Ambulatory Visit: Payer: Self-pay | Admitting: Internal Medicine

## 2023-09-03 DIAGNOSIS — Z1231 Encounter for screening mammogram for malignant neoplasm of breast: Secondary | ICD-10-CM

## 2023-10-07 ENCOUNTER — Ambulatory Visit

## 2023-10-13 ENCOUNTER — Ambulatory Visit
Admission: RE | Admit: 2023-10-13 | Discharge: 2023-10-13 | Disposition: A | Discharge status: Home / Self Care | Source: Ambulatory Visit | Attending: Internal Medicine | Admitting: Internal Medicine

## 2023-10-13 DIAGNOSIS — Z1231 Encounter for screening mammogram for malignant neoplasm of breast: Secondary | ICD-10-CM | POA: Insufficient documentation
